# Patient Record
Sex: Female | Born: 1960 | Race: White | Hispanic: No | Marital: Married | Smoking: Former smoker
Health system: Southern US, Community
[De-identification: ages and names within clinical notes are randomized; demographics above are authoritative.]

## PROBLEM LIST (undated history)

## (undated) DIAGNOSIS — L4050 Arthropathic psoriasis, unspecified: Secondary | ICD-10-CM

## (undated) DIAGNOSIS — M1990 Unspecified osteoarthritis, unspecified site: Secondary | ICD-10-CM

## (undated) DIAGNOSIS — G43909 Migraine, unspecified, not intractable, without status migrainosus: Secondary | ICD-10-CM

## (undated) DIAGNOSIS — M797 Fibromyalgia: Secondary | ICD-10-CM

## (undated) DIAGNOSIS — F4323 Adjustment disorder with mixed anxiety and depressed mood: Secondary | ICD-10-CM

## (undated) DIAGNOSIS — T8859XA Other complications of anesthesia, initial encounter: Secondary | ICD-10-CM

## (undated) DIAGNOSIS — E7800 Pure hypercholesterolemia, unspecified: Secondary | ICD-10-CM

## (undated) DIAGNOSIS — R112 Nausea with vomiting, unspecified: Secondary | ICD-10-CM

## (undated) DIAGNOSIS — F32A Depression, unspecified: Secondary | ICD-10-CM

## (undated) DIAGNOSIS — R51 Headache: Secondary | ICD-10-CM

## (undated) DIAGNOSIS — K2950 Unspecified chronic gastritis without bleeding: Secondary | ICD-10-CM

## (undated) DIAGNOSIS — K219 Gastro-esophageal reflux disease without esophagitis: Secondary | ICD-10-CM

## (undated) DIAGNOSIS — U071 COVID-19: Secondary | ICD-10-CM

## (undated) DIAGNOSIS — R7303 Prediabetes: Secondary | ICD-10-CM

## (undated) DIAGNOSIS — Z9889 Other specified postprocedural states: Secondary | ICD-10-CM

## (undated) DIAGNOSIS — F419 Anxiety disorder, unspecified: Secondary | ICD-10-CM

## (undated) DIAGNOSIS — K589 Irritable bowel syndrome without diarrhea: Secondary | ICD-10-CM

## (undated) DIAGNOSIS — E119 Type 2 diabetes mellitus without complications: Secondary | ICD-10-CM

## (undated) DIAGNOSIS — T4145XA Adverse effect of unspecified anesthetic, initial encounter: Secondary | ICD-10-CM

## (undated) HISTORY — DX: Irritable bowel syndrome, unspecified: K58.9

## (undated) HISTORY — DX: Fibromyalgia: M79.7

## (undated) HISTORY — DX: Migraine, unspecified, not intractable, without status migrainosus: G43.909

## (undated) HISTORY — DX: Unspecified osteoarthritis, unspecified site: M19.90

## (undated) HISTORY — DX: Adjustment disorder with mixed anxiety and depressed mood: F43.23

## (undated) HISTORY — DX: Unspecified chronic gastritis without bleeding: K29.50

## (undated) HISTORY — DX: Type 2 diabetes mellitus without complications: E11.9

## (undated) HISTORY — DX: COVID-19: U07.1

## (undated) HISTORY — PX: KNEE SURGERY: SHX244

## (undated) HISTORY — DX: Pure hypercholesterolemia, unspecified: E78.00

---

## 2000-01-19 HISTORY — PX: CERVICAL DISC SURGERY: SHX588

## 2000-08-31 ENCOUNTER — Encounter: Payer: Self-pay | Admitting: *Deleted

## 2000-08-31 ENCOUNTER — Ambulatory Visit (HOSPITAL_COMMUNITY): Admission: RE | Admit: 2000-08-31 | Discharge: 2000-08-31 | Payer: Self-pay | Admitting: *Deleted

## 2000-09-29 ENCOUNTER — Encounter: Payer: Self-pay | Admitting: Neurosurgery

## 2000-09-29 ENCOUNTER — Ambulatory Visit (HOSPITAL_COMMUNITY): Admission: RE | Admit: 2000-09-29 | Discharge: 2000-10-01 | Payer: Self-pay | Admitting: Neurosurgery

## 2000-11-22 ENCOUNTER — Ambulatory Visit (HOSPITAL_COMMUNITY): Admission: RE | Admit: 2000-11-22 | Discharge: 2000-11-22 | Payer: Self-pay | Admitting: Neurosurgery

## 2000-11-22 ENCOUNTER — Encounter: Payer: Self-pay | Admitting: Neurosurgery

## 2001-04-20 ENCOUNTER — Encounter: Payer: Self-pay | Admitting: Neurosurgery

## 2001-04-20 ENCOUNTER — Encounter: Admission: RE | Admit: 2001-04-20 | Discharge: 2001-04-20 | Payer: Self-pay | Admitting: Neurosurgery

## 2007-01-19 HISTORY — PX: ANKLE FRACTURE SURGERY: SHX122

## 2012-09-06 ENCOUNTER — Other Ambulatory Visit: Payer: Self-pay | Admitting: Gastroenterology

## 2012-09-06 DIAGNOSIS — R109 Unspecified abdominal pain: Secondary | ICD-10-CM

## 2012-09-08 ENCOUNTER — Other Ambulatory Visit: Payer: Self-pay

## 2012-09-08 ENCOUNTER — Ambulatory Visit
Admission: RE | Admit: 2012-09-08 | Discharge: 2012-09-08 | Disposition: A | Discharge status: Home / Self Care | Payer: PRIVATE HEALTH INSURANCE | Source: Ambulatory Visit | Attending: Gastroenterology | Admitting: Gastroenterology

## 2012-09-08 DIAGNOSIS — R109 Unspecified abdominal pain: Secondary | ICD-10-CM

## 2012-09-08 MED ORDER — IOHEXOL 300 MG/ML  SOLN
100.0000 mL | Freq: Once | INTRAMUSCULAR | Status: AC | PRN
  Administered 2012-09-08: 100 mL via INTRAVENOUS

## 2012-09-20 HISTORY — PX: ESOPHAGOGASTRODUODENOSCOPY: SHX1529

## 2012-11-22 HISTORY — PX: COLONOSCOPY: SHX174

## 2013-07-03 ENCOUNTER — Other Ambulatory Visit: Payer: Self-pay | Admitting: Neurosurgery

## 2013-07-26 ENCOUNTER — Encounter (HOSPITAL_COMMUNITY): Payer: Self-pay | Admitting: Pharmacy Technician

## 2013-07-26 ENCOUNTER — Inpatient Hospital Stay (HOSPITAL_COMMUNITY): Admission: RE | Admit: 2013-07-26 | Payer: PRIVATE HEALTH INSURANCE | Source: Ambulatory Visit

## 2013-07-27 ENCOUNTER — Encounter (HOSPITAL_COMMUNITY): Payer: Self-pay

## 2013-07-27 ENCOUNTER — Encounter (HOSPITAL_COMMUNITY)
Admission: RE | Admit: 2013-07-27 | Discharge: 2013-07-27 | Disposition: A | Discharge status: Home / Self Care | Payer: 59 | Source: Ambulatory Visit | Attending: Neurosurgery | Admitting: Neurosurgery

## 2013-07-27 DIAGNOSIS — Z87891 Personal history of nicotine dependence: Secondary | ICD-10-CM | POA: Insufficient documentation

## 2013-07-27 DIAGNOSIS — Z01812 Encounter for preprocedural laboratory examination: Secondary | ICD-10-CM | POA: Insufficient documentation

## 2013-07-27 DIAGNOSIS — M5020 Other cervical disc displacement, unspecified cervical region: Secondary | ICD-10-CM | POA: Insufficient documentation

## 2013-07-27 HISTORY — DX: Other complications of anesthesia, initial encounter: T88.59XA

## 2013-07-27 HISTORY — DX: Headache: R51

## 2013-07-27 HISTORY — DX: Unspecified osteoarthritis, unspecified site: M19.90

## 2013-07-27 HISTORY — DX: Adverse effect of unspecified anesthetic, initial encounter: T41.45XA

## 2013-07-27 LAB — BASIC METABOLIC PANEL
Anion gap: 15 (ref 5–15)
BUN: 11 mg/dL (ref 6–23)
CO2: 25 mEq/L (ref 19–32)
Calcium: 9.1 mg/dL (ref 8.4–10.5)
Chloride: 100 mEq/L (ref 96–112)
Creatinine, Ser: 0.89 mg/dL (ref 0.50–1.10)
GFR calc Af Amer: 86 mL/min — ABNORMAL LOW (ref >90)
GFR calc non Af Amer: 74 mL/min — ABNORMAL LOW (ref >90)
Glucose, Bld: 94 mg/dL (ref 70–99)
Potassium: 3.9 mEq/L (ref 3.7–5.3)
Sodium: 140 mEq/L (ref 137–147)

## 2013-07-27 LAB — CBC WITH DIFFERENTIAL/PLATELET
Basophils Absolute: 0 10*3/uL (ref 0.0–0.1)
Basophils Relative: 0 % (ref 0–1)
Eosinophils Absolute: 0.1 10*3/uL (ref 0.0–0.7)
Eosinophils Relative: 2 % (ref 0–5)
HCT: 37.8 % (ref 36.0–46.0)
Hemoglobin: 12.4 g/dL (ref 12.0–15.0)
Lymphocytes Relative: 36 % (ref 12–46)
Lymphs Abs: 2.6 10*3/uL (ref 0.7–4.0)
MCH: 27.4 pg (ref 26.0–34.0)
MCHC: 32.8 g/dL (ref 30.0–36.0)
MCV: 83.4 fL (ref 78.0–100.0)
Monocytes Absolute: 0.6 10*3/uL (ref 0.1–1.0)
Monocytes Relative: 8 % (ref 3–12)
Neutro Abs: 3.9 10*3/uL (ref 1.7–7.7)
Neutrophils Relative %: 54 % (ref 43–77)
Platelets: 397 10*3/uL (ref 150–400)
RBC: 4.53 MIL/uL (ref 3.87–5.11)
RDW: 14.6 % (ref 11.5–15.5)
WBC: 7.2 10*3/uL (ref 4.0–10.5)

## 2013-07-27 LAB — SURGICAL PCR SCREEN
MRSA, PCR: NEGATIVE
Staphylococcus aureus: POSITIVE — AB

## 2013-07-27 LAB — HCG, SERUM, QUALITATIVE: Preg, Serum: NEGATIVE

## 2013-07-27 NOTE — Pre-Procedure Instructions (Addendum)
Mandy Grant  07/27/2013   Your procedure is scheduled on:  08/07/13  [facility] short stay admitting at 530 AM.  Call this number if you have problems the morning of surgery: [phone]   Remember:   Do not eat food or drink liquids after midnight.   Take these medicines the morning of surgery with A SIP OF WATER: none     STOP all herbel meds, nsaids (aleve,naproxen,advil,ibuprofen) 5 days prior to surgery(08/01/13) including vitamins ,aspirin   Do not wear jewelry, make-up or nail polish.  Do not wear lotions, powders, or perfumes. You may wear deodorant.  Do not shave 48 hours prior to surgery. Men may shave face and neck.  Do not bring valuables to the hospital.  [facility] is not responsible                  for any belongings or valuables.               Contacts, dentures or bridgework may not be worn into surgery.  Leave suitcase in the car. After surgery it may be brought to your room.  For patients admitted to the hospital, discharge time is determined by your                treatment team.               Patients discharged the day of surgery will not be allowed to drive  home.  Name and phone number of your driver:   Special Instructions:  Special Instructions: [name] - Preparing for Surgery  Before surgery, you can play an important role.  Because skin is not sterile, your skin needs to be as free of germs as possible.  You can reduce the number of germs on you skin by washing with CHG (chlorahexidine gluconate) soap before surgery.  CHG is an antiseptic cleaner which kills germs and bonds with the skin to continue killing germs even after washing.  Please DO NOT use if you have an allergy to CHG or antibacterial soaps.  If your skin becomes reddened/irritated stop using the CHG and inform your nurse when you arrive at Short Stay.  Do not shave (including legs and underarms) for at least 48 hours prior to the first CHG shower.  You may shave your  face.  Please follow these instructions carefully:   1.  Shower with CHG Soap the night before surgery and the morning of Surgery.  2.  If you choose to wash your hair, wash your hair first as usual with your normal shampoo.  3.  After you shampoo, rinse your hair and body thoroughly to remove the Shampoo.  4.  Use CHG as you would any other liquid soap.  You can apply chg directly  to the skin and wash gently with scrungie or a clean washcloth.  5.  Apply the CHG Soap to your body ONLY FROM THE NECK DOWN.  Do not use on open wounds or open sores.  Avoid contact with your eyes ears, mouth and genitals (private parts).  Wash genitals (private parts)       with your normal soap.  6.  Wash thoroughly, paying special attention to the area where your surgery will be performed.  7.  Thoroughly rinse your body with warm water from the neck down.  8.  DO NOT shower/wash with your normal soap after using and rinsing off the CHG Soap.  9.  Pat yourself dry  with a clean towel.            10.  Wear clean pajamas.            11.  Place clean sheets on your bed the night of your first shower and do not sleep with pets.  Day of Surgery  Do not apply any lotions/deodorants the morning of surgery.  Please wear clean clothes to the hospital/surgery center.   Please read over the following fact sheets that you were given: Pain Booklet, Coughing and Deep Breathing, MRSA Information and Surgical Site Infection Prevention

## 2013-08-06 MED ORDER — CEFAZOLIN SODIUM-DEXTROSE 2-3 GM-% IV SOLR
2.0000 g | INTRAVENOUS | Status: AC
  Administered 2013-08-07: 2 g via INTRAVENOUS
  Filled 2013-08-06: qty 50

## 2013-08-06 MED ORDER — DEXAMETHASONE SODIUM PHOSPHATE 10 MG/ML IJ SOLN
10.0000 mg | INTRAMUSCULAR | Status: DC
  Filled 2013-08-06: qty 1

## 2013-08-07 ENCOUNTER — Encounter (HOSPITAL_COMMUNITY): Payer: 59 | Admitting: Certified Registered"

## 2013-08-07 ENCOUNTER — Encounter (HOSPITAL_COMMUNITY): Payer: Self-pay | Admitting: *Deleted

## 2013-08-07 ENCOUNTER — Encounter (HOSPITAL_COMMUNITY)
Admission: RE | Disposition: A | Discharge status: Home / Self Care | Payer: Self-pay | Source: Ambulatory Visit | Attending: Neurosurgery

## 2013-08-07 ENCOUNTER — Inpatient Hospital Stay (HOSPITAL_COMMUNITY)
Admission: RE | Admit: 2013-08-07 | Discharge: 2013-08-08 | DRG: 473 | Disposition: A | Discharge status: Home / Self Care | Payer: 59 | Source: Ambulatory Visit | Attending: Neurosurgery | Admitting: Neurosurgery

## 2013-08-07 ENCOUNTER — Ambulatory Visit (HOSPITAL_COMMUNITY): Payer: 59 | Admitting: Certified Registered"

## 2013-08-07 ENCOUNTER — Ambulatory Visit (HOSPITAL_COMMUNITY): Payer: 59

## 2013-08-07 DIAGNOSIS — M5010 Cervical disc disorder with radiculopathy, unspecified cervical region: Secondary | ICD-10-CM | POA: Diagnosis present

## 2013-08-07 DIAGNOSIS — Z79899 Other long term (current) drug therapy: Secondary | ICD-10-CM

## 2013-08-07 DIAGNOSIS — Z87891 Personal history of nicotine dependence: Secondary | ICD-10-CM

## 2013-08-07 DIAGNOSIS — M5020 Other cervical disc displacement, unspecified cervical region: Principal | ICD-10-CM | POA: Diagnosis present

## 2013-08-07 DIAGNOSIS — Z981 Arthrodesis status: Secondary | ICD-10-CM

## 2013-08-07 DIAGNOSIS — Z791 Long term (current) use of non-steroidal anti-inflammatories (NSAID): Secondary | ICD-10-CM

## 2013-08-07 DIAGNOSIS — Z882 Allergy status to sulfonamides status: Secondary | ICD-10-CM

## 2013-08-07 HISTORY — PX: ANTERIOR CERVICAL DECOMP/DISCECTOMY FUSION: SHX1161

## 2013-08-07 SURGERY — ANTERIOR CERVICAL DECOMPRESSION/DISCECTOMY FUSION 1 LEVEL
Anesthesia: General | Site: Spine Cervical

## 2013-08-07 MED ORDER — PROMETHAZINE HCL 25 MG/ML IJ SOLN
12.5000 mg | Freq: Four times a day (QID) | INTRAMUSCULAR | Status: DC | PRN
  Administered 2013-08-07: 25 mg via INTRAVENOUS
  Filled 2013-08-07: qty 1

## 2013-08-07 MED ORDER — LACTATED RINGERS IV SOLN
INTRAVENOUS | Status: DC | PRN
  Administered 2013-08-07: 07:00:00 via INTRAVENOUS

## 2013-08-07 MED ORDER — SODIUM CHLORIDE 0.9 % IV SOLN: 250.0000 mL | INTRAVENOUS | Status: DC

## 2013-08-07 MED ORDER — ACETAMINOPHEN 325 MG PO TABS
650.0000 mg | ORAL_TABLET | ORAL | Status: DC | PRN
  Administered 2013-08-08: 650 mg via ORAL
  Filled 2013-08-07: qty 2

## 2013-08-07 MED ORDER — CYCLOBENZAPRINE HCL 10 MG PO TABS
10.0000 mg | ORAL_TABLET | Freq: Three times a day (TID) | ORAL | Status: DC | PRN
  Administered 2013-08-07 (×2): 10 mg via ORAL
  Filled 2013-08-07 (×2): qty 1

## 2013-08-07 MED ORDER — PHENYLEPHRINE HCL 10 MG/ML IJ SOLN
INTRAMUSCULAR | Status: DC | PRN
  Administered 2013-08-07: 80 ug via INTRAVENOUS

## 2013-08-07 MED ORDER — OXYCODONE HCL 5 MG PO TABS
5.0000 mg | ORAL_TABLET | Freq: Once | ORAL | Status: DC | PRN
Start: 2013-08-07 — End: 2013-08-07

## 2013-08-07 MED ORDER — HYDROMORPHONE HCL PF 1 MG/ML IJ SOLN
INTRAMUSCULAR | Status: AC
  Filled 2013-08-07: qty 1

## 2013-08-07 MED ORDER — SODIUM CHLORIDE 0.9 % IJ SOLN: 3.0000 mL | INTRAMUSCULAR | Status: DC | PRN

## 2013-08-07 MED ORDER — ALUM & MAG HYDROXIDE-SIMETH 200-200-20 MG/5ML PO SUSP: 30.0000 mL | Freq: Four times a day (QID) | ORAL | Status: DC | PRN

## 2013-08-07 MED ORDER — LIDOCAINE HCL (CARDIAC) 20 MG/ML IV SOLN
INTRAVENOUS | Status: AC
  Filled 2013-08-07: qty 5

## 2013-08-07 MED ORDER — 0.9 % SODIUM CHLORIDE (POUR BTL) OPTIME
TOPICAL | Status: DC | PRN
  Administered 2013-08-07: 1000 mL

## 2013-08-07 MED ORDER — HYDROMORPHONE HCL PF 1 MG/ML IJ SOLN
0.2500 mg | INTRAMUSCULAR | Status: DC | PRN
  Administered 2013-08-07 (×4): 0.5 mg via INTRAVENOUS

## 2013-08-07 MED ORDER — OXYCODONE HCL 5 MG/5ML PO SOLN: 5.0000 mg | Freq: Once | ORAL | Status: DC | PRN

## 2013-08-07 MED ORDER — ROCURONIUM BROMIDE 50 MG/5ML IV SOLN
INTRAVENOUS | Status: AC
  Filled 2013-08-07: qty 1

## 2013-08-07 MED ORDER — ONDANSETRON HCL 4 MG/2ML IJ SOLN
4.0000 mg | INTRAMUSCULAR | Status: DC | PRN
  Administered 2013-08-07 (×2): 4 mg via INTRAVENOUS
  Filled 2013-08-07: qty 2

## 2013-08-07 MED ORDER — THROMBIN 5000 UNITS EX SOLR
CUTANEOUS | Status: DC | PRN
  Administered 2013-08-07 (×2): 5000 [IU] via TOPICAL

## 2013-08-07 MED ORDER — FENTANYL CITRATE 0.05 MG/ML IJ SOLN
INTRAMUSCULAR | Status: DC | PRN
  Administered 2013-08-07: 50 ug via INTRAVENOUS
  Administered 2013-08-07: 100 ug via INTRAVENOUS
  Administered 2013-08-07: 50 ug via INTRAVENOUS

## 2013-08-07 MED ORDER — ONDANSETRON HCL 4 MG/2ML IJ SOLN
INTRAMUSCULAR | Status: AC
  Filled 2013-08-07: qty 2

## 2013-08-07 MED ORDER — HEMOSTATIC AGENTS (NO CHARGE) OPTIME
TOPICAL | Status: DC | PRN
  Administered 2013-08-07: 1 via TOPICAL

## 2013-08-07 MED ORDER — CEFAZOLIN SODIUM 1-5 GM-% IV SOLN
1.0000 g | Freq: Three times a day (TID) | INTRAVENOUS | Status: AC
  Administered 2013-08-07 – 2013-08-08 (×2): 1 g via INTRAVENOUS
  Filled 2013-08-07 (×3): qty 50

## 2013-08-07 MED ORDER — MEPERIDINE HCL 25 MG/ML IJ SOLN: 6.2500 mg | INTRAMUSCULAR | Status: DC | PRN

## 2013-08-07 MED ORDER — LIDOCAINE HCL (CARDIAC) 20 MG/ML IV SOLN
INTRAVENOUS | Status: DC | PRN
  Administered 2013-08-07: 100 mg via INTRAVENOUS

## 2013-08-07 MED ORDER — SENNA 8.6 MG PO TABS
1.0000 | ORAL_TABLET | Freq: Two times a day (BID) | ORAL | Status: DC
  Administered 2013-08-08: 8.6 mg via ORAL
  Filled 2013-08-07 (×2): qty 1

## 2013-08-07 MED ORDER — PHENOL 1.4 % MT LIQD
1.0000 | OROMUCOSAL | Status: DC | PRN
  Administered 2013-08-07: 1 via OROMUCOSAL
  Filled 2013-08-07: qty 177

## 2013-08-07 MED ORDER — HYDROMORPHONE HCL PF 1 MG/ML IJ SOLN
0.5000 mg | INTRAMUSCULAR | Status: DC | PRN
  Administered 2013-08-07 – 2013-08-08 (×3): 1 mg via INTRAVENOUS
  Filled 2013-08-07 (×3): qty 1

## 2013-08-07 MED ORDER — PROPOFOL 10 MG/ML IV BOLUS
INTRAVENOUS | Status: AC
  Filled 2013-08-07: qty 20

## 2013-08-07 MED ORDER — ONDANSETRON HCL 4 MG/2ML IJ SOLN
INTRAMUSCULAR | Status: DC | PRN
  Administered 2013-08-07: 4 mg via INTRAVENOUS

## 2013-08-07 MED ORDER — MENTHOL 3 MG MT LOZG
1.0000 | LOZENGE | OROMUCOSAL | Status: DC | PRN
  Filled 2013-08-07: qty 9

## 2013-08-07 MED ORDER — NEOSTIGMINE METHYLSULFATE 10 MG/10ML IV SOLN
INTRAVENOUS | Status: DC | PRN
  Administered 2013-08-07: 4 mg via INTRAVENOUS

## 2013-08-07 MED ORDER — MIDAZOLAM HCL 2 MG/2ML IJ SOLN
INTRAMUSCULAR | Status: AC
  Filled 2013-08-07: qty 2

## 2013-08-07 MED ORDER — SODIUM CHLORIDE 0.9 % IJ SOLN
3.0000 mL | Freq: Two times a day (BID) | INTRAMUSCULAR | Status: DC
Start: 2013-08-07 — End: 2013-08-08

## 2013-08-07 MED ORDER — DEXAMETHASONE SODIUM PHOSPHATE 4 MG/ML IJ SOLN
INTRAMUSCULAR | Status: DC | PRN
  Administered 2013-08-07: 10 mg via INTRAVENOUS

## 2013-08-07 MED ORDER — MIDAZOLAM HCL 5 MG/5ML IJ SOLN
INTRAMUSCULAR | Status: DC | PRN
  Administered 2013-08-07: 2 mg via INTRAVENOUS

## 2013-08-07 MED ORDER — OXYCODONE-ACETAMINOPHEN 5-325 MG PO TABS
1.0000 | ORAL_TABLET | ORAL | Status: DC | PRN
Start: 2013-08-07 — End: 2013-08-08
  Administered 2013-08-07: 2 via ORAL
  Filled 2013-08-07: qty 2

## 2013-08-07 MED ORDER — GLYCOPYRROLATE 0.2 MG/ML IJ SOLN
INTRAMUSCULAR | Status: DC | PRN
Start: 2013-08-07 — End: 2013-08-07
  Administered 2013-08-07: 0.6 mg via INTRAVENOUS

## 2013-08-07 MED ORDER — HYDROCODONE-ACETAMINOPHEN 5-325 MG PO TABS
1.0000 | ORAL_TABLET | ORAL | Status: DC | PRN
Start: 2013-08-07 — End: 2013-08-08
  Administered 2013-08-08: 1 via ORAL
  Filled 2013-08-07: qty 1

## 2013-08-07 MED ORDER — ROCURONIUM BROMIDE 100 MG/10ML IV SOLN
INTRAVENOUS | Status: DC | PRN
  Administered 2013-08-07: 50 mg via INTRAVENOUS

## 2013-08-07 MED ORDER — ONDANSETRON HCL 4 MG/2ML IJ SOLN: 4.0000 mg | Freq: Once | INTRAMUSCULAR | Status: DC | PRN

## 2013-08-07 MED ORDER — SODIUM CHLORIDE 0.9 % IR SOLN
Status: DC | PRN
  Administered 2013-08-07: 09:00:00

## 2013-08-07 MED ORDER — PROPOFOL 10 MG/ML IV BOLUS
INTRAVENOUS | Status: DC | PRN
  Administered 2013-08-07: 120 mg via INTRAVENOUS

## 2013-08-07 MED ORDER — ACETAMINOPHEN 650 MG RE SUPP: 650.0000 mg | RECTAL | Status: DC | PRN

## 2013-08-07 MED ORDER — FENTANYL CITRATE 0.05 MG/ML IJ SOLN
INTRAMUSCULAR | Status: AC
  Filled 2013-08-07: qty 5

## 2013-08-07 SURGICAL SUPPLY — 61 items
BAG DECANTER FOR FLEXI CONT (MISCELLANEOUS) ×2 IMPLANT
BENZOIN TINCTURE PRP APPL 2/3 (GAUZE/BANDAGES/DRESSINGS) ×2 IMPLANT
BLADE 10 SAFETY STRL DISP (BLADE) IMPLANT
BRUSH SCRUB EZ PLAIN DRY (MISCELLANEOUS) ×2 IMPLANT
BUR MATCHSTICK NEURO 3.0 LAGG (BURR) ×2 IMPLANT
CANISTER SUCT 3000ML (MISCELLANEOUS) ×2 IMPLANT
CONT SPEC 4OZ CLIKSEAL STRL BL (MISCELLANEOUS) ×2 IMPLANT
DRAPE C-ARM 42X72 X-RAY (DRAPES) ×4 IMPLANT
DRAPE LAPAROTOMY 100X72 PEDS (DRAPES) ×2 IMPLANT
DRAPE MICROSCOPE LEICA (MISCELLANEOUS) ×2 IMPLANT
DRAPE POUCH INSTRU U-SHP 10X18 (DRAPES) ×2 IMPLANT
DRILL BIT (BIT) ×2 IMPLANT
DURAPREP 6ML APPLICATOR 50/CS (WOUND CARE) ×2 IMPLANT
ELECT COATED BLADE 2.86 ST (ELECTRODE) ×2 IMPLANT
ELECT REM PT RETURN 9FT ADLT (ELECTROSURGICAL) ×2
ELECTRODE REM PT RTRN 9FT ADLT (ELECTROSURGICAL) ×1 IMPLANT
GAUZE SPONGE 4X4 16PLY XRAY LF (GAUZE/BANDAGES/DRESSINGS) IMPLANT
GLOVE BIO SURGEON STRL SZ8 (GLOVE) ×2 IMPLANT
GLOVE BIOGEL PI IND STRL 7.0 (GLOVE) ×3 IMPLANT
GLOVE BIOGEL PI IND STRL 8.5 (GLOVE) ×1 IMPLANT
GLOVE BIOGEL PI INDICATOR 7.0 (GLOVE) ×3
GLOVE BIOGEL PI INDICATOR 8.5 (GLOVE) ×1
GLOVE ECLIPSE 8.5 STRL (GLOVE) ×2 IMPLANT
GLOVE ECLIPSE 9.0 STRL (GLOVE) ×2 IMPLANT
GLOVE EXAM NITRILE LRG STRL (GLOVE) IMPLANT
GLOVE EXAM NITRILE MD LF STRL (GLOVE) IMPLANT
GLOVE EXAM NITRILE XL STR (GLOVE) IMPLANT
GLOVE EXAM NITRILE XS STR PU (GLOVE) IMPLANT
GLOVE SURG SS PI 7.0 STRL IVOR (GLOVE) ×6 IMPLANT
GOWN STRL REUS W/ TWL LRG LVL3 (GOWN DISPOSABLE) ×1 IMPLANT
GOWN STRL REUS W/ TWL XL LVL3 (GOWN DISPOSABLE) ×3 IMPLANT
GOWN STRL REUS W/TWL 2XL LVL3 (GOWN DISPOSABLE) IMPLANT
GOWN STRL REUS W/TWL LRG LVL3 (GOWN DISPOSABLE) ×1
GOWN STRL REUS W/TWL XL LVL3 (GOWN DISPOSABLE) ×3
HALTER HD/CHIN CERV TRACTION D (MISCELLANEOUS) ×2 IMPLANT
KIT BASIN OR (CUSTOM PROCEDURE TRAY) ×2 IMPLANT
KIT ROOM TURNOVER OR (KITS) ×2 IMPLANT
NEEDLE SPNL 20GX3.5 QUINCKE YW (NEEDLE) ×2 IMPLANT
NS IRRIG 1000ML POUR BTL (IV SOLUTION) ×2 IMPLANT
PACK LAMINECTOMY NEURO (CUSTOM PROCEDURE TRAY) ×2 IMPLANT
PAD ARMBOARD 7.5X6 YLW CONV (MISCELLANEOUS) ×6 IMPLANT
PEEK CAGE 7X14X11 (Cage) ×2 IMPLANT
PLATE ELITE VISION 25MM (Plate) ×2 IMPLANT
RUBBERBAND STERILE (MISCELLANEOUS) ×4 IMPLANT
SCREW ST 13X4.5XST VAR NS (Screw) ×1 IMPLANT
SCREW ST 13X4XST VA NS SPNE (Screw) ×4 IMPLANT
SCREW ST VAR 4 ATL (Screw) ×4 IMPLANT
SCREW ST VAR 4.5 ATL (Screw) ×1 IMPLANT
SPONGE GAUZE 4X4 12PLY (GAUZE/BANDAGES/DRESSINGS) ×2 IMPLANT
SPONGE INTESTINAL PEANUT (DISPOSABLE) ×2 IMPLANT
SPONGE SURGIFOAM ABS GEL SZ50 (HEMOSTASIS) ×2 IMPLANT
STRIP CLOSURE SKIN 1/2X4 (GAUZE/BANDAGES/DRESSINGS) ×2 IMPLANT
SUT PDS AB 5-0 P3 18 (SUTURE) ×2 IMPLANT
SUT VIC AB 3-0 SH 8-18 (SUTURE) ×2 IMPLANT
SYR 20ML ECCENTRIC (SYRINGE) ×2 IMPLANT
TAPE CLOTH 4X10 WHT NS (GAUZE/BANDAGES/DRESSINGS) ×2 IMPLANT
TAPE CLOTH SURG 4X10 WHT LF (GAUZE/BANDAGES/DRESSINGS) ×2 IMPLANT
TOWEL OR 17X24 6PK STRL BLUE (TOWEL DISPOSABLE) ×2 IMPLANT
TOWEL OR 17X26 10 PK STRL BLUE (TOWEL DISPOSABLE) ×2 IMPLANT
TRAP SPECIMEN MUCOUS 40CC (MISCELLANEOUS) ×2 IMPLANT
WATER STERILE IRR 1000ML POUR (IV SOLUTION) ×2 IMPLANT

## 2013-08-07 NOTE — Transfer of Care (Signed)
`  Immediate Anesthesia Transfer of Care Note  Patient: Mandy Grant  Procedure(s) Performed: Procedure(s): CERVICAL SIX-SEVEN ANTERIOR CERVICAL DECOMPRESSION FUSION WITH PEEK CAGE,PLATING. (N/A)  Patient Location: PACU  Anesthesia Type:General  Level of Consciousness: awake, alert , oriented and patient cooperative  Airway & Oxygen Therapy: Patient Spontanous Breathing and Patient connected to nasal cannula oxygen  Post-op Assessment: Report given to PACU RN, Post -op Vital signs reviewed and stable and Patient moving all extremities  Post vital signs: Reviewed and stable  Complications: No apparent anesthesia complications

## 2013-08-07 NOTE — Progress Notes (Signed)
Utilization review completed.  

## 2013-08-07 NOTE — Anesthesia Preprocedure Evaluation (Signed)
Anesthesia Evaluation  Patient identified by MRN, date of birth, ID band Patient awake    Reviewed: Allergy & Precautions, H&P , NPO status , Patient's Chart, lab work & pertinent test results  Airway Mallampati: I TM Distance: >3 FB Neck ROM: Full    Dental   Pulmonary former smoker,          Cardiovascular     Neuro/Psych    GI/Hepatic   Endo/Other    Renal/GU      Musculoskeletal   Abdominal   Peds  Hematology   Anesthesia Other Findings   Reproductive/Obstetrics                           Anesthesia Physical Anesthesia Plan  ASA: II  Anesthesia Plan: General   Post-op Pain Management:    Induction: Intravenous  Airway Management Planned: Oral ETT  Additional Equipment:   Intra-op Plan:   Post-operative Plan: Extubation in OR  Informed Consent: I have reviewed the patients History and Physical, chart, labs and discussed the procedure including the risks, benefits and alternatives for the proposed anesthesia with the patient or authorized representative who has indicated his/her understanding and acceptance.     Plan Discussed with: CRNA and Surgeon  Anesthesia Plan Comments:         Anesthesia Quick Evaluation  

## 2013-08-07 NOTE — Anesthesia Postprocedure Evaluation (Signed)
Anesthesia Post Note  Patient: Mandy Grant  Procedure(s) Performed: Procedure(s) (LRB): CERVICAL SIX-SEVEN ANTERIOR CERVICAL DECOMPRESSION FUSION WITH PEEK CAGE,PLATING. (N/A)  Anesthesia type: general  Patient location: PACU  Post pain: Pain level controlled  Post assessment: Patient's Cardiovascular Status Stable  Last Vitals:  Filed Vitals:   08/07/13 1111  BP:   Pulse: 83  Temp:   Resp: 15    Post vital signs: Reviewed and stable  Level of consciousness: sedated  Complications: No apparent anesthesia complications

## 2013-08-07 NOTE — Op Note (Signed)
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  Postoperative diagnosis: Same  Procedure Name: Reexploration of anterior cervical fusion C4-C6 with removal instrumentation.   C6-7 anterior cervical discectomy with interbody fusion utilizing interbody peek cage, locally harvested autograft, and anterior plate instrumentation.  Surgeon:Durenda Pechacek A.Hoa Deriso, M.D.  Asst. Surgeon: Venetia MaxonStern  Anesthesia: General  Indication: 53 year old female status post previous C4-C6 anterior cervical discectomy infusion presents with neck pain with left upper extremity symptoms consistent with a left-sided C7 radiculopathy per workup demonstrates evidence of marked disc degeneration with a left-sided C6-7 disc herniation causing compression the lateral spinal cord and exiting C7 nerve root. Patient presents now for C6-7 into her cervical discectomy and fusion. This will require removal of her previous C6-C4 anterior cervical plating.  Operative note: After induction anesthesia, patient positioned supine with her neck slightly extended and held in place with halter traction. Anterior cervical region prepped and draped. Incision made through old scar. This carried down sharply to the platysma. This is then divided vertically and dissection proceeded on the medial border of the sternocleidomastoid muscle and carotid sheath. Trachea and esophagus were mobilized and retracted towards the left. Prevertebral fascia was stripped off the anterior spinal column. Longus colli muscles elevated bilaterally/Carter. Deep self-retaining traction placed intraoperative fluoroscopy used levels were confirmed. Previous instrumentation from C4-C6 was dissected free. The plate was disassembled and removed. Fusion from C4-C6 was quite solid. Disc space at C6-7 was then incised 15  blade in her a 10 or fashion. Anterior osteophytes removed using Leksell rongeurs. Bone was saved for later autografting. Discectomy been performed using pituitary rongeurs forward and backward angle Carlen curettes Kerrison rongeurs the high-speed drill. Almost the disc removed down to level of the posterior annulus. Microscope and brought into the field and used throughout the remainder of the discectomy. Remaining aspects of annulus and osteophytes removed using a high-speed drill down to level of the posterior longitudinal ligament. Posterior longitudinal ligament was elevated and resected so fashion appears rongeurs. Underlying thecal sac was identified. Y. center decompression and perform anything the bodies of C6 and C7. Decompression MCH are afraid are wide anterior foraminotomies were then performed on course exiting C7 nerve roots bilaterally. All elements the disc herniation and spondylosis appeared to be removed. There is no evidence of injury to thecal sac and nerve roots. Wound is then irrigated with and bike solution. Gelfoam was placed topically for hemostasis which Ascent be good. 7 mm peek cage was then packed with morselized autograft and intact his into the C6-7 disc space and recessed slightly from the anterior cortical margin. 25 mm Atlantis anterior cervical plate was then placed over the C6 and C7 levels. This is an attachment or fluoroscopic guidance using 13 mm variable-angle screws 2 each at both levels were all 4 screws were given a final tightening down to be solidly within the bone. Locking screws engaged both levels. Final images revealed good position the bone graft and hardware at proper upper level with normal lamina spine. Wounds that area one final time. Hemostasis was assured with bipolar cautery. Wounds and close in layers. Steri-Strips triggers were applied. There no apparent competitions. Patient tolerated the procedure well and she returns to the recovery room postop.

## 2013-08-07 NOTE — Anesthesia Procedure Notes (Addendum)
Procedure Name: Intubation Date/Time: 08/07/2013 7:54 AM Performed by: Jerilee HohMUMM, Tessia Kassin N Pre-anesthesia Checklist: Patient identified, Emergency Drugs available, Suction available, Patient being monitored and Timeout performed Patient Re-evaluated:Patient Re-evaluated prior to inductionOxygen Delivery Method: Circle system utilized Preoxygenation: Pre-oxygenation with 100% oxygen Intubation Type: IV induction Ventilation: Mask ventilation without difficulty Laryngoscope Size: Miller and 3 Grade View: Grade I Tube type: Oral Tube size: 7.0 mm Number of attempts: 1 Airway Equipment and Method: Stylet Placement Confirmation: ETT inserted through vocal cords under direct vision,  positive ETCO2 and breath sounds checked- equal and bilateral Secured at: 20 cm Tube secured with: Tape Dental Injury: Teeth and Oropharynx as per pre-operative assessment

## 2013-08-07 NOTE — Brief Op Note (Signed)
08/07/2013  9:18 AM  PATIENT:  Mandy Grant  53 y.o. female  PRE-OPERATIVE DIAGNOSIS:  spondylosis  POST-OPERATIVE DIAGNOSIS:  spondylosis  PROCEDURE:  Procedure(s): CERVICAL SIX-SEVEN ANTERIOR CERVICAL DECOMPRESSION FUSION WITH PEEK CAGE,PLATING. (N/A)  SURGEON:  Surgeon(s) and Role:    * Temple PaciniHenry A Jyll Tomaro, MD - Primary  PHYSICIAN ASSISTANT:   ASSISTANTS: Stern   ANESTHESIA:   general  EBL:  Total I/O In: 1000 [I.V.:1000] Out: -   BLOOD ADMINISTERED:none  DRAINS: none   LOCAL MEDICATIONS USED:  NONE  SPECIMEN:  No Specimen  DISPOSITION OF SPECIMEN:  N/A  COUNTS:  YES  TOURNIQUET:  * No tourniquets in log *  DICTATION: .Dragon Dictation  PLAN OF CARE: Admit to inpatient   PATIENT DISPOSITION:  PACU - hemodynamically stable.   Delay start of Pharmacological VTE agent (>24hrs) due to surgical blood loss or risk of bleeding: yes

## 2013-08-07 NOTE — H&P (Signed)
Mandy Grant is an 53 y.o. female.   Chief Complaint: And neck and left arm pain HPI: 53 year old female status post previous C4-C6 anterior cervical setting fusion presents now with neck pain with radiation to her left upper trimming associated with weakness and some sensory loss. Workup has demonstrated evidence of a significant left-sided C6-7 disc herniation. Patient's failed conservative management and presents now for anterior cervical decompression and fusion.  Past Medical History  Diagnosis Date  . Complication of anesthesia     "hard time waking up"  . Arthritis   . Headache(784.0)     constant with nausea    Past Surgical History  Procedure Laterality Date  . Cervical disc surgery  02  . Ankle fracture surgery Right 09    plates screws    History reviewed. No pertinent family history. Social History:  reports that she quit smoking about 27 years ago. Her smoking use included Cigarettes. She has a 2 pack-year smoking history. She does not have any smokeless tobacco history on file. She reports that she does not drink alcohol or use illicit drugs.  Allergies:  Allergies  Allergen Reactions  . Sulfa Antibiotics Nausea And Vomiting and Rash    Medications Prior to Admission  Medication Sig Dispense Refill  . ibuprofen (ADVIL,MOTRIN) 200 MG tablet Take 200 mg by mouth every 6 (six) hours as needed.      . naproxen sodium (ANAPROX) 220 MG tablet Take 220 mg by mouth 2 (two) times daily with a meal.        No results found for this or any previous visit (from the past 48 hour(s)). No results found.  Review of Systems  Constitutional: Negative.   HENT: Negative.   Eyes: Negative.   Respiratory: Negative.   Cardiovascular: Negative.   Gastrointestinal: Negative.   Genitourinary: Negative.   Musculoskeletal: Negative.   Skin: Negative.   Neurological: Negative.   Endo/Heme/Allergies: Negative.   Psychiatric/Behavioral: Negative.     Blood pressure 124/58, pulse  73, temperature 98.5 F (36.9 C), temperature source Oral, resp. rate 20, weight 89.812 kg (198 lb), SpO2 98.00%. Physical Exam  Constitutional: She is oriented to person, place, and time. She appears well-developed and well-nourished. No distress.  HENT:  Head: Normocephalic and atraumatic.  Right Ear: External ear normal.  Left Ear: External ear normal.  Nose: Nose normal.  Mouth/Throat: Oropharynx is clear and moist. No oropharyngeal exudate.  Eyes: Conjunctivae and EOM are normal. Pupils are equal, round, and reactive to light. Right eye exhibits no discharge. Left eye exhibits no discharge.  Neck: Normal range of motion. Neck supple. No tracheal deviation present. No thyromegaly present.  Cardiovascular: Normal rate, regular rhythm, normal heart sounds and intact distal pulses.  Exam reveals no friction rub.   No murmur heard. Respiratory: Effort normal and breath sounds normal. No respiratory distress. She has no wheezes.  GI: Soft. Bowel sounds are normal. She exhibits no distension. There is no tenderness.  Musculoskeletal: Normal range of motion. She exhibits no edema and no tenderness.  Neurological: She is alert and oriented to person, place, and time. She has normal reflexes. She displays normal reflexes. No cranial nerve deficit. She exhibits normal muscle tone. Coordination normal.  Skin: Skin is warm and dry. No rash noted. She is not diaphoretic. No erythema. No pallor.  Psychiatric: She has a normal mood and affect. Her behavior is normal. Thought content normal.     Assessment/Plan Left C6-7 herniated nuclear pulposus with radiculopathy. Plan reexploration of  previous C4-C6 fusion with removal of instrumentation. C6-7 anterior cervical discectomy with interbody cage, local harvested autograft, and anterior plate instrumentation. Risks and benefits been explained. Patient wishes to proceed.  Kate Larock A 08/07/2013, 7:33 AM

## 2013-08-08 MED ORDER — CYCLOBENZAPRINE HCL 10 MG PO TABS: 10.0000 mg | ORAL_TABLET | Freq: Three times a day (TID) | ORAL | Status: DC | PRN

## 2013-08-08 MED ORDER — HYDROCODONE-ACETAMINOPHEN 5-325 MG PO TABS: 1.0000 | ORAL_TABLET | ORAL | Status: DC | PRN

## 2013-08-08 MED ORDER — PROMETHAZINE HCL 12.5 MG PO TABS: 12.5000 mg | ORAL_TABLET | Freq: Four times a day (QID) | ORAL | Status: DC | PRN

## 2013-08-08 NOTE — Discharge Instructions (Signed)

## 2013-08-08 NOTE — Discharge Summary (Signed)
Physician Discharge Summary  Patient ID: Mandy Grant MRN: [id number] DOB/AGE: 52/01/1960 52 y.o.  Admit date: 08/07/2013 Discharge date: 08/08/2013  Admission Diagnoses:  Discharge Diagnoses:  Principal Problem:   HNP (herniated nucleus pulposus), cervical Active Problems:   Herniation of cervical intervertebral disc with radiculopathy   Discharged Condition: good  Hospital Course: Patient admitted to the hospital where she underwent an uncomplicated C6-7 anterior cervical discectomy and fusion. Postoperative she is doing well. Neck and upper extremity symptoms improved. Wound healing well. Up ambulating. Ready for discharge home.  Consults:   Significant Diagnostic Studies:   Treatments:   Discharge Exam: Blood pressure 123/65, pulse 93, temperature 98.6 F (37 C), temperature source Oral, resp. rate 20, weight 89.812 kg (198 lb), SpO2 95.00%. Awake and alert. Oriented and appropriate. Cranial nerve function intact. Motor and sensory function of the extremities intact. Wound clean and dry. Chest and abdomen benign.  Disposition:      Medication List         cyclobenzaprine 10 MG tablet  Commonly known as:  FLEXERIL  Take 1 tablet (10 mg total) by mouth 3 (three) times daily as needed for muscle spasms.     HYDROcodone-acetaminophen 5-325 MG per tablet  Commonly known as:  NORCO/VICODIN  Take 1-2 tablets by mouth every 4 (four) hours as needed for moderate pain.     ibuprofen 200 MG tablet  Commonly known as:  ADVIL,MOTRIN  Take 200 mg by mouth every 6 (six) hours as needed.     naproxen sodium 220 MG tablet  Commonly known as:  ANAPROX  Take 220 mg by mouth 2 (two) times daily with a meal.     promethazine 12.5 MG tablet  Commonly known as:  PHENERGAN  Take 1 tablet (12.5 mg total) by mouth every 6 (six) hours as needed for nausea or vomiting.         Signed: Duyen Beckom A 08/08/2013, 10:59 AM

## 2013-08-08 NOTE — Progress Notes (Signed)
Patient alert and oriented, mae's well, voiding adequate amount of urine, swallowing without difficulty, and no c/o pain. Patient discharged home with family. Script and discharged instructions given to patient. Patient and family stated understanding of instructions given.

## 2013-08-09 ENCOUNTER — Encounter (HOSPITAL_COMMUNITY): Payer: Self-pay | Admitting: Neurosurgery

## 2016-01-07 ENCOUNTER — Ambulatory Visit (INDEPENDENT_AMBULATORY_CARE_PROVIDER_SITE_OTHER): Payer: 59 | Admitting: Sports Medicine

## 2016-01-07 ENCOUNTER — Encounter: Payer: Self-pay | Admitting: Sports Medicine

## 2016-01-07 ENCOUNTER — Ambulatory Visit (INDEPENDENT_AMBULATORY_CARE_PROVIDER_SITE_OTHER): Payer: 59

## 2016-01-07 DIAGNOSIS — M79672 Pain in left foot: Secondary | ICD-10-CM | POA: Diagnosis not present

## 2016-01-07 DIAGNOSIS — M722 Plantar fascial fibromatosis: Secondary | ICD-10-CM

## 2016-01-07 MED ORDER — TRIAMCINOLONE ACETONIDE 40 MG/ML IJ SUSP: 20.0000 mg | Freq: Once | INTRAMUSCULAR | Status: DC

## 2016-01-07 MED ORDER — DICLOFENAC SODIUM 75 MG PO TBEC: 75.0000 mg | DELAYED_RELEASE_TABLET | Freq: Two times a day (BID) | ORAL | 0 refills | Status: DC

## 2016-01-07 NOTE — Patient Instructions (Signed)

## 2016-01-07 NOTE — Progress Notes (Signed)
Subjective: April HoldingJeanne Manygoats is a 55 y.o. female patient presents to office with complaint of heel pain on the left. Patient admits to post static dyskinesia for months in duration. Patient has treated this problem with stretching, icing, medrol which she hasn't started taking yet with no relief. Denies any other pedal complaints.   Patient Active Problem List   Diagnosis Date Noted  . HNP (herniated nucleus pulposus), cervical 08/07/2013  . Herniation of cervical intervertebral disc with radiculopathy 08/07/2013    Current Outpatient Prescriptions on File Prior to Visit  Medication Sig Dispense Refill  . cyclobenzaprine (FLEXERIL) 10 MG tablet Take 1 tablet (10 mg total) by mouth 3 (three) times daily as needed for muscle spasms. 30 tablet 0  . HYDROcodone-acetaminophen (NORCO/VICODIN) 5-325 MG per tablet Take 1-2 tablets by mouth every 4 (four) hours as needed for moderate pain. 60 tablet 0  . ibuprofen (ADVIL,MOTRIN) 200 MG tablet Take 200 mg by mouth every 6 (six) hours as needed.    . naproxen sodium (ANAPROX) 220 MG tablet Take 220 mg by mouth 2 (two) times daily with a meal.    . promethazine (PHENERGAN) 12.5 MG tablet Take 1 tablet (12.5 mg total) by mouth every 6 (six) hours as needed for nausea or vomiting. 30 tablet 0   No current facility-administered medications on file prior to visit.     Allergies  Allergen Reactions  . Sulfa Antibiotics Nausea And Vomiting and Rash    Objective: Physical Exam General: The patient is alert and oriented x3 in no acute distress.  Dermatology: Skin is warm, dry and supple bilateral lower extremities. Nails 1-10 are normal. There is no erythema, edema, no eccymosis, no open lesions present. Integument is otherwise unremarkable.  Vascular: Dorsalis Pedis pulse and Posterior Tibial pulse are 2/4 bilateral. Capillary fill time is immediate to all digits.  Neurological: Grossly intact to light touch with an achilles reflex of +2/5 and a negative  Tinel's sign bilateral.  Musculoskeletal: Tenderness to palpation at the medial calcaneal tubercale and through the insertion of the plantar fascia on the left foot. No pain with compression of calcaneus bilateral. No pain with tuning fork to calcaneus bilateral. No pain with calf compression bilateral. There is decreased Ankle joint range of motion bilateral. All other joints range of motion within normal limits bilateral. Strength 5/5 in all groups bilateral.   Gait: Unassisted, Antalgic avoid weight on left/right heel  Xray, Left foot:  Normal osseous mineralization. Joint spaces preserved. No fracture/dislocation/boney destruction. Minimal Calcaneal spur present with mild thickening of plantar fascia. No other soft tissue abnormalities or radiopaque foreign bodies.   Assessment and Plan: Problem List Items Addressed This Visit    None    Visit Diagnoses    Plantar fasciitis of left foot    -  Primary   Relevant Medications   triamcinolone acetonide (KENALOG-40) injection 20 mg   diclofenac (VOLTAREN) 75 MG EC tablet   diclofenac (VOLTAREN) 75 MG EC tablet   Other Relevant Orders   DG Foot 2 Views Left   Pain of left heel       Relevant Medications   triamcinolone acetonide (KENALOG-40) injection 20 mg   diclofenac (VOLTAREN) 75 MG EC tablet   diclofenac (VOLTAREN) 75 MG EC tablet   Other Relevant Orders   DG Foot 2 Views Left      -Complete examination performed.  -Xrays reviewed -Discussed with patient in detail the condition of plantar fasciitis, how this occurs and general treatment options. Explained  both conservative and surgical treatments.  -After oral consent and aseptic prep, injected a mixture containing 1 ml of 2% plain lidocaine, 1 ml 0.5% plain marcaine, 0.5 ml of kenalog 10 and 0.5 ml of dexamethasone phosphate into left heel. Post-injection care discussed with patient.  -Rx Diclofenac to start after Medrol dose pack is completed of which she already  has -Recommended good supportive shoes and advised use of OTC insert. Explained to patient that if these orthoses work well, we will continue with these. If these do not improve her condition and  pain, we will consider a new pair of custom molded orthoses. - Explained in detail the use of the fascial brace for left which was dispensed at today's visit. -Explained and dispensed to patient daily stretching exercises. -Recommend patient to ice affected area 1-2x daily. -Patient to return to office in 3-4 weeks for follow up or sooner if problems or questions arise.  Asencion Islamitorya Sharnese Heath, DPM

## 2016-01-28 ENCOUNTER — Ambulatory Visit (INDEPENDENT_AMBULATORY_CARE_PROVIDER_SITE_OTHER): Payer: 59 | Admitting: Sports Medicine

## 2016-01-28 ENCOUNTER — Encounter: Payer: Self-pay | Admitting: Sports Medicine

## 2016-01-28 DIAGNOSIS — M722 Plantar fascial fibromatosis: Secondary | ICD-10-CM

## 2016-01-28 DIAGNOSIS — M79672 Pain in left foot: Secondary | ICD-10-CM

## 2016-01-28 NOTE — Progress Notes (Signed)
Subjective: Mandy Grant is a 56 y.o. female patient returns for follow up eval after injection #1 three weeks ago for heel pai/fasciitis on the left. Patient states that symptoms are 2/10 but has not been icing, stretching, wearing brace like she should since she has been caring for her mom who is sick. Denies any other pedal complaints.   Patient Active Problem List   Diagnosis Date Noted  . HNP (herniated nucleus pulposus), cervical 08/07/2013  . Herniation of cervical intervertebral disc with radiculopathy 08/07/2013    Current Outpatient Prescriptions on File Prior to Visit  Medication Sig Dispense Refill  . amoxicillin-clavulanate (AUGMENTIN) 875-125 MG tablet     . cyclobenzaprine (FLEXERIL) 10 MG tablet Take 1 tablet (10 mg total) by mouth 3 (three) times daily as needed for muscle spasms. 30 tablet 0  . diclofenac (VOLTAREN) 75 MG EC tablet Take 1 tablet (75 mg total) by mouth 2 (two) times daily. 30 tablet 0  . diclofenac (VOLTAREN) 75 MG EC tablet Take 1 tablet (75 mg total) by mouth 2 (two) times daily. 28 tablet 0  . HYDROcodone-acetaminophen (NORCO/VICODIN) 5-325 MG per tablet Take 1-2 tablets by mouth every 4 (four) hours as needed for moderate pain. 60 tablet 0  . ibuprofen (ADVIL,MOTRIN) 200 MG tablet Take 200 mg by mouth every 6 (six) hours as needed.    Marland Kitchen JANUMET 50-500 MG tablet     . naproxen sodium (ANAPROX) 220 MG tablet Take 220 mg by mouth 2 (two) times daily with a meal.    . predniSONE (DELTASONE) 10 MG tablet     . promethazine (PHENERGAN) 12.5 MG tablet Take 1 tablet (12.5 mg total) by mouth every 6 (six) hours as needed for nausea or vomiting. 30 tablet 0   Current Facility-Administered Medications on File Prior to Visit  Medication Dose Route Frequency Provider Last Rate Last Dose  . triamcinolone acetonide (KENALOG-40) injection 20 mg  20 mg Other Once Asencion Islam, DPM        Allergies  Allergen Reactions  . Sulfa Antibiotics Nausea And Vomiting and  Rash    Objective: Physical Exam General: The patient is alert and oriented x3 in no acute distress.  Dermatology: Skin is warm, dry and supple bilateral lower extremities. Nails 1-10 are normal. There is no erythema, edema, no eccymosis, no open lesions present. Integument is otherwise unremarkable.  Vascular: Dorsalis Pedis pulse and Posterior Tibial pulse are 2/4 bilateral. Capillary fill time is immediate to all digits.  Neurological: Grossly intact to light touch with an achilles reflex of +2/5 and a negative Tinel's sign bilateral.  Musculoskeletal: Decreased tenderness to palpation at the medial calcaneal tubercale and through the insertion of the plantar fascia on the left foot. No pain with compression of calcaneus bilateral. No pain with tuning fork to calcaneus bilateral. No pain with calf compression bilateral. There is decreased Ankle joint range of motion bilateral. All other joints range of motion within normal limits bilateral. Strength 5/5 in all groups bilateral.   Assessment and Plan: Problem List Items Addressed This Visit    None    Visit Diagnoses    Plantar fasciitis of left foot    -  Primary   Pain of left heel          -Complete examination performed.  -Re-discussed with patient in detail the condition of plantar fasciitis, how this occurs and general treatment options. Explained both conservative and surgical treatments.  -Continue with monitoring  -Recommended good supportive shoes and  inserts; patient would like to get custom inserts later this year. -Continue with fascial brace. -Continue with daily stretching  -Recommend patient to ice affected area 1-2x daily as previously instructed to prevent a flare up. -Patient to return to office in 6 weeks for follow up or sooner if problems or questions arise.  Asencion Islamitorya Anuar Walgren, DPM

## 2016-02-04 ENCOUNTER — Ambulatory Visit: Payer: 59 | Admitting: Sports Medicine

## 2016-02-05 ENCOUNTER — Ambulatory Visit: Payer: 59 | Admitting: Sports Medicine

## 2016-02-06 ENCOUNTER — Encounter: Payer: Self-pay | Admitting: Sports Medicine

## 2016-02-06 ENCOUNTER — Ambulatory Visit (INDEPENDENT_AMBULATORY_CARE_PROVIDER_SITE_OTHER): Payer: 59 | Admitting: Sports Medicine

## 2016-02-06 VITALS — BP 102/59 | HR 89 | Resp 18

## 2016-02-06 DIAGNOSIS — M722 Plantar fascial fibromatosis: Secondary | ICD-10-CM | POA: Diagnosis not present

## 2016-02-06 NOTE — Progress Notes (Signed)
Subjective: Mandy Grant is a 56 y.o. female patient returns for follow up eval after injection #1, 4 weeks ago for heel pain/fasciitis on the left at medial aspect. Patient states that symptoms are starting to worsen and has been icing, stretching, wearing brace. Reports she wants shot because she's going to be on her foot a lot. Denies any other pedal complaints.   Patient Active Problem List   Diagnosis Date Noted  . HNP (herniated nucleus pulposus), cervical 08/07/2013  . Herniation of cervical intervertebral disc with radiculopathy 08/07/2013    Current Outpatient Prescriptions on File Prior to Visit  Medication Sig Dispense Refill  . amoxicillin-clavulanate (AUGMENTIN) 875-125 MG tablet     . cyclobenzaprine (FLEXERIL) 10 MG tablet Take 1 tablet (10 mg total) by mouth 3 (three) times daily as needed for muscle spasms. 30 tablet 0  . diclofenac (VOLTAREN) 75 MG EC tablet Take 1 tablet (75 mg total) by mouth 2 (two) times daily. 30 tablet 0  . diclofenac (VOLTAREN) 75 MG EC tablet Take 1 tablet (75 mg total) by mouth 2 (two) times daily. 28 tablet 0  . HYDROcodone-acetaminophen (NORCO/VICODIN) 5-325 MG per tablet Take 1-2 tablets by mouth every 4 (four) hours as needed for moderate pain. 60 tablet 0  . ibuprofen (ADVIL,MOTRIN) 200 MG tablet Take 200 mg by mouth every 6 (six) hours as needed.    Marland Kitchen. JANUMET 50-500 MG tablet     . naproxen sodium (ANAPROX) 220 MG tablet Take 220 mg by mouth 2 (two) times daily with a meal.    . predniSONE (DELTASONE) 10 MG tablet     . promethazine (PHENERGAN) 12.5 MG tablet Take 1 tablet (12.5 mg total) by mouth every 6 (six) hours as needed for nausea or vomiting. 30 tablet 0   Current Facility-Administered Medications on File Prior to Visit  Medication Dose Route Frequency Provider Last Rate Last Dose  . triamcinolone acetonide (KENALOG-40) injection 20 mg  20 mg Other Once Asencion Islamitorya Mikaela Hilgeman, DPM        Allergies  Allergen Reactions  . Sulfa Antibiotics  Nausea And Vomiting and Rash    Objective: Physical Exam General: The patient is alert and oriented x3 in no acute distress.  Dermatology: Skin is warm, dry and supple bilateral lower extremities. Nails 1-10 are normal. There is no erythema, edema, no eccymosis, no open lesions present. Integument is otherwise unremarkable.  Vascular: Dorsalis Pedis pulse and Posterior Tibial pulse are 2/4 bilateral. Capillary fill time is immediate to all digits.  Neurological: Grossly intact to light touch with an achilles reflex of +2/5 and a negative Tinel's sign bilateral.  Musculoskeletal: No tenderness to palpation at the medial calcaneal tubercale however there is tenderness at lateral aspect and through the insertion of the plantar fascia on the left foot. No pain with compression of calcaneus bilateral. No pain with tuning fork to calcaneus bilateral. No pain with calf compression bilateral. There is decreased Ankle joint range of motion bilateral. All other joints range of motion within normal limits bilateral. Strength 5/5 in all groups bilateral.   Assessment and Plan: Problem List Items Addressed This Visit    None    Visit Diagnoses    Plantar fasciitis of left foot    -  Primary   lateral band      -Complete examination performed.  -Re-discussed with patient in detail the condition of plantar fasciitis, how this occurs and general treatment options. Explained both conservative and surgical treatments.  -After oral consent and  aseptic prep, injected a mixture containing 1 ml of 2% plain lidocaine, 1 ml 0.5% plain marcaine, 0.5 ml of kenalog 10 and 0.5 ml of dexamethasone phosphate into left heel lateral band without complication. Post-injection care discussed with patient.  -Recommended good supportive shoes and inserts; patient would like to get custom inserts later this year. -Rx night splint -May use TENS unit that she has -Continue with fascial brace. -Continue with daily stretching   -Recommend patient to ice affected area 1-2x daily as previously instructed to prevent a flare up. -Patient to return to office as scheduled for follow up or sooner if problems or questions arise. If no improvement PT/EPAT.  Asencion Islam, DPM

## 2016-03-10 ENCOUNTER — Ambulatory Visit (INDEPENDENT_AMBULATORY_CARE_PROVIDER_SITE_OTHER): Payer: 59 | Admitting: Sports Medicine

## 2016-03-10 ENCOUNTER — Encounter: Payer: Self-pay | Admitting: Sports Medicine

## 2016-03-10 DIAGNOSIS — M722 Plantar fascial fibromatosis: Secondary | ICD-10-CM

## 2016-03-10 DIAGNOSIS — M79672 Pain in left foot: Secondary | ICD-10-CM | POA: Diagnosis not present

## 2016-03-10 MED ORDER — DICLOFENAC SODIUM 75 MG PO TBEC: 75.0000 mg | DELAYED_RELEASE_TABLET | Freq: Two times a day (BID) | ORAL | 0 refills | Status: DC

## 2016-03-10 NOTE — Patient Instructions (Signed)

## 2016-03-10 NOTE — Progress Notes (Signed)
Subjective: Mandy Grant is a 56 y.o. female patient returns for follow up eval after injection #2, 4 weeks ago for heel pain/fasciitis on the left at medial and lateral aspect. Patient states that symptoms are still flaring up, feels about the same. Patient is getting frustrated because she feels that its taking long to heal and that she has a lot going on with her mom being sick and recent trip that prevents this from getting better. Denies any other pedal complaints.   Patient Active Problem List   Diagnosis Date Noted  . HNP (herniated nucleus pulposus), cervical 08/07/2013  . Herniation of cervical intervertebral disc with radiculopathy 08/07/2013    Current Outpatient Prescriptions on File Prior to Visit  Medication Sig Dispense Refill  . amoxicillin-clavulanate (AUGMENTIN) 875-125 MG tablet     . cyclobenzaprine (FLEXERIL) 10 MG tablet Take 1 tablet (10 mg total) by mouth 3 (three) times daily as needed for muscle spasms. 30 tablet 0  . diclofenac (VOLTAREN) 75 MG EC tablet Take 1 tablet (75 mg total) by mouth 2 (two) times daily. 30 tablet 0  . diclofenac (VOLTAREN) 75 MG EC tablet Take 1 tablet (75 mg total) by mouth 2 (two) times daily. 28 tablet 0  . HYDROcodone-acetaminophen (NORCO/VICODIN) 5-325 MG per tablet Take 1-2 tablets by mouth every 4 (four) hours as needed for moderate pain. 60 tablet 0  . ibuprofen (ADVIL,MOTRIN) 200 MG tablet Take 200 mg by mouth every 6 (six) hours as needed.    Marland Kitchen JANUMET 50-500 MG tablet     . naproxen sodium (ANAPROX) 220 MG tablet Take 220 mg by mouth 2 (two) times daily with a meal.    . predniSONE (DELTASONE) 10 MG tablet     . promethazine (PHENERGAN) 12.5 MG tablet Take 1 tablet (12.5 mg total) by mouth every 6 (six) hours as needed for nausea or vomiting. 30 tablet 0   Current Facility-Administered Medications on File Prior to Visit  Medication Dose Route Frequency Provider Last Rate Last Dose  . triamcinolone acetonide (KENALOG-40) injection  20 mg  20 mg Other Once Mandy Islam, DPM        Allergies  Allergen Reactions  . Sulfa Antibiotics Nausea And Vomiting and Rash    Objective: Physical Exam General: The patient is alert and oriented x3 in no acute distress.  Dermatology: Skin is warm, dry and supple bilateral lower extremities. Nails 1-10 are normal. There is no erythema, edema, no eccymosis, no open lesions present. Integument is otherwise unremarkable.  Vascular: Dorsalis Pedis pulse and Posterior Tibial pulse are 2/4 bilateral. Capillary fill time is immediate to all digits.  Neurological: Grossly intact to light touch with an achilles reflex of +2/5 and a negative Tinel's sign bilateral.  Musculoskeletal: Mild tenderness to palpation at the medial calcaneal tubercale and at lateral aspect and through the insertion of the plantar fascia on the left foot. No pain with compression of calcaneus bilateral. No pain with tuning fork to calcaneus bilateral. No pain with calf compression bilateral. There is decreased Ankle joint range of motion bilateral. All other joints range of motion within normal limits bilateral. Strength 5/5 in all groups bilateral.   Assessment and Plan: Problem List Items Addressed This Visit    None    Visit Diagnoses    Plantar fasciitis of left foot    -  Primary   Relevant Medications   diclofenac (VOLTAREN) 75 MG EC tablet   Pain of left heel  Relevant Medications   diclofenac (VOLTAREN) 75 MG EC tablet     -Complete examination performed.  -Re-discussed with patient in detail the condition of plantar fasciitis, how this occurs and general treatment options. Explained both conservative and surgical treatments.  -Patient opt for home PT -Rx [facility] topical anti-inflammatory -Refilled diclofenac  -Recommended good supportive shoes and inserts; patient would like to get custom inserts later this year. -Continue with night splint -May use TENS unit that she has  -Continue with  fascial brace. -Continue with daily stretching  -Recommend patient to ice affected area 1-2x daily as previously instructed to prevent a flare up. -Patient to return to office as needed for follow up or sooner if problems or questions arise. Patient will call office for Rx for PT if she has no improvement with home PT  Mandy Grant, DPM

## 2016-03-11 ENCOUNTER — Telehealth: Payer: Self-pay | Admitting: *Deleted

## 2016-03-11 MED ORDER — NONFORMULARY OR COMPOUNDED ITEM: 2 refills | Status: DC

## 2016-03-11 NOTE — Telephone Encounter (Addendum)
-----   Message from Asencion Islamitorya [place], [place] sent at 03/10/2016  1:36 PM EST ----- Regarding: [facility] Topical anti-inflammatory cream. 03/11/2016-[facility] orders to [facility].

## 2016-08-12 DIAGNOSIS — K635 Polyp of colon: Secondary | ICD-10-CM | POA: Insufficient documentation

## 2016-08-12 DIAGNOSIS — E559 Vitamin D deficiency, unspecified: Secondary | ICD-10-CM | POA: Insufficient documentation

## 2016-08-12 DIAGNOSIS — E663 Overweight: Secondary | ICD-10-CM | POA: Insufficient documentation

## 2016-08-12 DIAGNOSIS — M19042 Primary osteoarthritis, left hand: Secondary | ICD-10-CM | POA: Insufficient documentation

## 2016-08-12 DIAGNOSIS — E782 Mixed hyperlipidemia: Secondary | ICD-10-CM | POA: Insufficient documentation

## 2016-08-12 DIAGNOSIS — J3089 Other allergic rhinitis: Secondary | ICD-10-CM | POA: Insufficient documentation

## 2016-08-12 DIAGNOSIS — E1165 Type 2 diabetes mellitus with hyperglycemia: Secondary | ICD-10-CM

## 2016-08-12 HISTORY — DX: Type 2 diabetes mellitus with hyperglycemia: E11.65

## 2016-11-04 ENCOUNTER — Ambulatory Visit (INDEPENDENT_AMBULATORY_CARE_PROVIDER_SITE_OTHER): Payer: 59 | Admitting: Sports Medicine

## 2016-11-04 ENCOUNTER — Encounter: Payer: Self-pay | Admitting: Sports Medicine

## 2016-11-04 DIAGNOSIS — M722 Plantar fascial fibromatosis: Secondary | ICD-10-CM

## 2016-11-04 DIAGNOSIS — Z8781 Personal history of (healed) traumatic fracture: Secondary | ICD-10-CM

## 2016-11-04 DIAGNOSIS — M79672 Pain in left foot: Secondary | ICD-10-CM

## 2016-11-04 NOTE — Progress Notes (Signed)
   Subjective:    Patient ID: Mandy Grant, female    DOB: 12/12/1960, 56 y.o.   MRN: [id number]  HPI    Review of Systems  All other systems reviewed and are negative.      Objective:   Physical Exam        Assessment & Plan:

## 2016-11-04 NOTE — Progress Notes (Signed)
Subjective: Mandy Grant is a 56 y.o. female patient returns for follow up eval of heel pain/fasciitis on the left  Reports symptoms are better at most 2/10. Reports that she wants new orthotics. Denies any other pedal complaints.   Patient Active Problem List   Diagnosis Date Noted  . HNP (herniated nucleus pulposus), cervical 08/07/2013  . Herniation of cervical intervertebral disc with radiculopathy 08/07/2013    Current Outpatient Prescriptions on File Prior to Visit  Medication Sig Dispense Refill  . amoxicillin-clavulanate (AUGMENTIN) 875-125 MG tablet     . cyclobenzaprine (FLEXERIL) 10 MG tablet Take 1 tablet (10 mg total) by mouth 3 (three) times daily as needed for muscle spasms. 30 tablet 0  . diclofenac (VOLTAREN) 75 MG EC tablet Take 1 tablet (75 mg total) by mouth 2 (two) times daily. 30 tablet 0  . diclofenac (VOLTAREN) 75 MG EC tablet Take 1 tablet (75 mg total) by mouth 2 (two) times daily. 28 tablet 0  . HYDROcodone-acetaminophen (NORCO/VICODIN) 5-325 MG per tablet Take 1-2 tablets by mouth every 4 (four) hours as needed for moderate pain. 60 tablet 0  . ibuprofen (ADVIL,MOTRIN) 200 MG tablet Take 200 mg by mouth every 6 (six) hours as needed.    Marland Kitchen. JANUMET 50-500 MG tablet     . naproxen sodium (ANAPROX) 220 MG tablet Take 220 mg by mouth 2 (two) times daily with a meal.    . NONFORMULARY OR COMPOUNDED ITEM [facility]: Antiinflammatory Cream - Diclofenac 3%, Baclofen 2%, Lidocaine 2%, apply 1-2 grams to affected area 3-4 times daily. 120 each 2  . predniSONE (DELTASONE) 10 MG tablet     . promethazine (PHENERGAN) 12.5 MG tablet Take 1 tablet (12.5 mg total) by mouth every 6 (six) hours as needed for nausea or vomiting. 30 tablet 0   Current Facility-Administered Medications on File Prior to Visit  Medication Dose Route Frequency Provider Last Rate Last Dose  . triamcinolone acetonide (KENALOG-40) injection 20 mg  20 mg Other Once Asencion IslamStover, Roan Miklos, DPM         Allergies  Allergen Reactions  . Sulfa Antibiotics Nausea And Vomiting and Rash    Objective: Physical Exam General: The patient is alert and oriented x3 in no acute distress.  Dermatology: Skin is warm, dry and supple bilateral lower extremities. Nails 1-10 are normal. There is no erythema, edema, no eccymosis, no open lesions present. Integument is otherwise unremarkable.  Vascular: Dorsalis Pedis pulse and Posterior Tibial pulse are 2/4 bilateral. Capillary fill time is immediate to all digits.  Neurological: Grossly intact to light touch with an achilles reflex of +2/5 and a negative Tinel's sign bilateral.  Musculoskeletal: Minimal tenderness to palpation at the medial calcaneal tubercale and at lateral aspect and through the insertion of the plantar fascia on the left foot. No pain with compression of calcaneus bilateral. No pain with tuning fork to calcaneus bilateral. No pain with calf compression bilateral. There is decreased Ankle joint range of motion bilateral. All other joints range of motion within normal limits bilateral. Strength 5/5 in all groups bilateral.  History of Right ankle fracture with occassional residual stiffness.   Assessment and Plan: Problem List Items Addressed This Visit    None    Visit Diagnoses    Plantar fasciitis of left foot    -  Primary   Pain of left heel       History of fracture of right ankle         -Complete examination performed.  -  Re-discussed with patient in detail the condition of plantar fasciitis, how this occurs and general treatment options. Explained both conservative and surgical treatments.  -Patient casted for custom molded orthotics with [employer] today, Rx sent to [facility] -Continue with daily stretching  -Return to office for pick up orthotics   Asencion Islam, DPM

## 2016-12-08 ENCOUNTER — Ambulatory Visit: Payer: 59 | Admitting: *Deleted

## 2016-12-08 DIAGNOSIS — M722 Plantar fascial fibromatosis: Secondary | ICD-10-CM

## 2016-12-08 NOTE — Patient Instructions (Signed)

## 2016-12-08 NOTE — Progress Notes (Signed)
Patient ID: Mandy Grant, female   DOB: 07/06/1960, 56 y.o.   MRN: [id number]   Patient presents for orthotic pick up.  Verbal and written break in and wear instructions given.  Patient will follow up in 4 weeks if symptoms worsen or fail to improve.

## 2017-10-25 ENCOUNTER — Encounter: Payer: Self-pay | Admitting: Gastroenterology

## 2018-07-19 DIAGNOSIS — G8929 Other chronic pain: Secondary | ICD-10-CM | POA: Insufficient documentation

## 2018-07-19 DIAGNOSIS — M25461 Effusion, right knee: Secondary | ICD-10-CM | POA: Insufficient documentation

## 2018-08-20 ENCOUNTER — Encounter: Payer: Self-pay | Admitting: Gastroenterology

## 2018-09-21 DIAGNOSIS — R0602 Shortness of breath: Secondary | ICD-10-CM | POA: Insufficient documentation

## 2018-09-22 DIAGNOSIS — R062 Wheezing: Secondary | ICD-10-CM | POA: Insufficient documentation

## 2018-09-22 DIAGNOSIS — R5381 Other malaise: Secondary | ICD-10-CM | POA: Insufficient documentation

## 2018-10-01 DIAGNOSIS — M791 Myalgia, unspecified site: Secondary | ICD-10-CM | POA: Insufficient documentation

## 2018-10-01 DIAGNOSIS — M2550 Pain in unspecified joint: Secondary | ICD-10-CM | POA: Insufficient documentation

## 2018-10-12 ENCOUNTER — Encounter: Payer: Self-pay | Admitting: Gastroenterology

## 2018-10-12 ENCOUNTER — Ambulatory Visit: Payer: PRIVATE HEALTH INSURANCE | Admitting: Gastroenterology

## 2018-10-12 ENCOUNTER — Other Ambulatory Visit: Payer: Self-pay

## 2018-10-12 VITALS — BP 130/72 | HR 84 | Temp 98.3°F | Ht 62.0 in | Wt 188.1 lb

## 2018-10-12 DIAGNOSIS — K581 Irritable bowel syndrome with constipation: Secondary | ICD-10-CM

## 2018-10-12 DIAGNOSIS — R945 Abnormal results of liver function studies: Secondary | ICD-10-CM

## 2018-10-12 DIAGNOSIS — K219 Gastro-esophageal reflux disease without esophagitis: Secondary | ICD-10-CM

## 2018-10-12 DIAGNOSIS — K760 Fatty (change of) liver, not elsewhere classified: Secondary | ICD-10-CM

## 2018-10-12 DIAGNOSIS — R7989 Other specified abnormal findings of blood chemistry: Secondary | ICD-10-CM

## 2018-10-12 DIAGNOSIS — Z86010 Personal history of colonic polyps: Secondary | ICD-10-CM | POA: Diagnosis not present

## 2018-10-12 MED ORDER — NA SULFATE-K SULFATE-MG SULF 17.5-3.13-1.6 GM/177ML PO SOLN: 1.0000 | Freq: Once | ORAL | 0 refills | Status: AC

## 2018-10-12 MED ORDER — PANTOPRAZOLE SODIUM 40 MG PO TBEC: 40.0000 mg | DELAYED_RELEASE_TABLET | Freq: Every day | ORAL | 3 refills | Status: DC

## 2018-10-12 NOTE — Patient Instructions (Addendum)
If you are age 57 or older, your body mass index should be between 23-30. Your Body mass index is 34.41 kg/m. If this is out of the aforementioned range listed, please consider follow up with your Primary Care Provider.  If you are age 75 or younger, your body mass index should be between 19-25. Your Body mass index is 34.41 kg/m. If this is out of the aformentioned range listed, please consider follow up with your Primary Care Provider.   To help prevent the possible spread of infection to our patients, communities, and staff; we will be implementing the following measures:  As of now we are not allowing any visitors/family members to accompany you to any upcoming appointments with [facility]. If you have any concerns about this please contact our office to discuss prior to the appointment.   You have been give a sample of  Linzess 129mcg  Please purchase the following medications over the counter and take as directed: Miralax 17g mixed with water by mouth once daily.  We have sent the following medications to your pharmacy for you to pick up at your convenience: Protonix 40mg  daily  You have been scheduled for an endoscopy and colonoscopy. Please follow the written instructions given to you at your visit today. Please pick up your prep supplies at the pharmacy within the next 1-3 days. If you use inhalers (even only as needed), please bring them with you on the day of your procedure. Your physician has requested that you go to www.startemmi.com and enter the access code given to you at your visit today. This web site gives a general overview about your procedure. However, you should still follow specific instructions given to you by our office regarding your preparation for the procedure.   You have been scheduled for an abdominal ultrasound at [facility] (1st floor [address] ). Please arrive 15 minutes prior to your appointment for registration. Make certain not to  have anything to eat or drink 6 hours prior to your appointment. Should you need to reschedule your appointment, please contact radiology at [phone]. This test typically takes about 30 minutes to perform.  Thank you,  Dr. Jackquline Denmark

## 2018-10-12 NOTE — Progress Notes (Signed)
Chief Complaint: Abdominal pain  Referring Provider:  Myrlene Broker, MD      ASSESSMENT AND PLAN;   #1. Postprandial GERD and epi pain.  #2. IBS with constipation  #3. H/O Polyps 09/2012. Due for rpt.  #4. Abn LFTs d/t fatty liver (likely)./ Dx with pre-DM (Dr Unk Lightning). Recent Covid-19 08/2018  Plan: - Linzess 145 mcg PO QD PRN every weekend. Samples given. - Start Miralax 17g po qd every day. - Protonix '40mg'$  po qd (#30), 6 refills - [place] abdo for further evaluation. - EGD/colon in 3-4 weeks at [facility] - If still with problems and the above WU is negative, proceed with CT scan abdo/pelvis.  -I have encouraged her to start exercising, watch calorie intake , avoid fried foods with him to reduce 6lb over 3 months. - FU in 12 weeks.  At follow-up, we would recheck LFTs.    HPI:    Gaytha Raybourn is a 58 y.o. female  Had COVID-19 08/2018, still can't smell and get short of breath when she walks.  Chest x-ray was unremarkable at Dr. Unk Lightning office.  She is definitely getting better but gradually.   Has been having postprandial epigastric and left upper quadrant abdominal pain with abdominal bloating. "  Feels like there is a baby on her left side".  Associated with abdominal bloating and constipation.  Would have pellet-like stools at the frequency of 1-2 times per week.  She has been drinking plenty of water.  No nonsteroidals.  When she takes Linzess 145 mcg, she has diarrhea lasting for 4 to 5 hours " just like somebody opened a faucet"  No melena or hematochezia.  Has occasional heartburn.  No odynophagia or dysphagia.  Seen at [facility] 08/20/2018, Dx with COVID-19.  WBC count was 14, hemoglobin 13.2, platelets 383, normal CMP except glucose 158, CO2 21, LDH 708, AST 40, ALT 57, alk phos 64.  Normal C-reactive protein and ferritin.  Normal PT PTT.  Her PO2 was 71  Subsequently seen by Dr. Unk Lightning and had blood work on 09/21/2018 which showed improvement in WBC count at  6.1, hemoglobin 13.9, platelets 364.  Liver function tests have normalized to AST 25, ALT 36, alk phos 54.  Hemoglobin A1c was 6.3.  She had negative chest x-ray.  Had normal lipase.  She is also been evaluated by rheumatology for possible Sjogren's syndrome.  Had negative ANA, rheumatoid factor.   Past GI procedures: -Colonoscopy 11/2012 ([facility])-colonic polyp S/P polypectomy, Bx- TA.,  Small internal hemorrhoids.  Otherwise normal to TI. Past Medical History:  Diagnosis Date  . Arthritis   . Complication of anesthesia    "hard time waking up"  . COVID-19 virus infection   . Headache(784.0)    constant with nausea  . IBS (irritable bowel syndrome)     Past Surgical History:  Procedure Laterality Date  . ANKLE FRACTURE SURGERY Right 09   plates screws  . ANTERIOR CERVICAL DECOMP/DISCECTOMY FUSION N/A 08/07/2013   Procedure: CERVICAL SIX-SEVEN ANTERIOR CERVICAL DECOMPRESSION FUSION WITH PEEK CAGE,PLATING.;  Surgeon: Charlie Pitter, MD;  Location: [place] [facility];  Service: Neurosurgery;  Laterality: N/A;  . CERVICAL DISC SURGERY  02  . COLONOSCOPY  11/22/2012   Colonic polyp, status post ploypectomy. Small internal hemorrhoids. Otherwise normal colonoscopy to terminal ileum  . ESOPHAGOGASTRODUODENOSCOPY  09/20/2012   Mild gastritis. Minimal scalloping od duodenal musosa of questionable importance (biopsied)    Family History  Problem Relation Age of Onset  . Colon cancer  Neg Hx   . Esophageal cancer Neg Hx     Social History   Tobacco Use  . Smoking status: Former Smoker    Packs/day: 0.50    Years: 4.00    Pack years: 2.00    Types: Cigarettes    Quit date: 07/28/1986    Years since quitting: 32.2  . Smokeless tobacco: Never Used  Substance Use Topics  . Alcohol use: No  . Drug use: No    Current Outpatient Medications  Medication Sig Dispense Refill  . Acetaminophen (TYLENOL PO) Take by mouth as needed.    Marland Kitchen estradiol-norethindrone (COMBIPATCH) 0.05-0.25 MG/DAY as  directed. 1 patch 2 times a week    . fluticasone (FLONASE) 50 MCG/ACT nasal spray as directed.    . linaclotide (LINZESS) 145 MCG CAPS capsule Take 1 capsule by mouth daily.    . meloxicam (MOBIC) 15 MG tablet Take 15 mg by mouth as needed.    . Vitamin D, Ergocalciferol, (DRISDOL) 1.25 MG (50000 UT) CAPS capsule 1 capsule once a week.    Marland Kitchen amoxicillin-clavulanate (AUGMENTIN) 875-125 MG tablet as needed.     . cyclobenzaprine (FLEXERIL) 10 MG tablet Take 1 tablet (10 mg total) by mouth 3 (three) times daily as needed for muscle spasms. (Patient not taking: Reported on 10/12/2018) 30 tablet 0  . diclofenac (VOLTAREN) 75 MG EC tablet Take 1 tablet (75 mg total) by mouth 2 (two) times daily. (Patient not taking: Reported on 10/12/2018) 28 tablet 0  . ipratropium-albuterol (DUONEB) 0.5-2.5 (3) MG/3ML SOLN INHALE 1 VIAL VIA NEBULIZER TWICE A DAY    . NONFORMULARY OR COMPOUNDED ITEM [facility]: Antiinflammatory Cream - Diclofenac 3%, Baclofen 2%, Lidocaine 2%, apply 1-2 grams to affected area 3-4 times daily. (Patient not taking: Reported on 10/12/2018) 120 each 2   Current Facility-Administered Medications  Medication Dose Route Frequency Provider Last Rate Last Dose  . triamcinolone acetonide (KENALOG-40) injection 20 mg  20 mg Other Once Landis Martins, DPM        Allergies  Allergen Reactions  . Levofloxacin Other (See Comments)  . Sulfa Antibiotics Nausea And Vomiting and Rash    Review of Systems:  Constitutional: Denies fever, chills, diaphoresis, appetite change and has fatigue.  HEENT: Cannot smell.  Taste has returned. Respiratory: Denies SOB, DOE, cough, chest tightness,  and wheezing.   Cardiovascular: Denies chest pain, palpitations and leg swelling.  Genitourinary: Denies dysuria, urgency, frequency, hematuria, flank pain and difficulty urinating.  Musculoskeletal: Has myalgias, back pain, joint swelling, arthralgias and gait problem.  Skin: No rash.  Neurological: Denies  dizziness, seizures, syncope, weakness, light-headedness, numbness and has headaches.  Hematological: Denies adenopathy. Easy bruising, personal or family bleeding history  Psychiatric/Behavioral: Has anxiety or depression     Physical Exam:    BP 130/72   Pulse 84   Temp 98.3 F (36.8 C)   Ht '5\' 2"'$  (1.575 m)   Wt 188 lb 2 oz (85.3 kg)   BMI 34.41 kg/m  Filed Weights   10/12/18 1335  Weight: 188 lb 2 oz (85.3 kg)   Constitutional:  Well-developed, in no acute distress. Psychiatric: Normal mood and affect. Behavior is normal. HEENT: Pupils normal.  Conjunctivae are normal. No scleral icterus. Neck supple.  Cardiovascular: Normal rate, regular rhythm. No edema Pulmonary/chest: Effort normal and breath sounds normal. No wheezing, rales or rhonchi. Abdominal: Soft, nondistended.  Epigastric tenderness. bowel sounds active throughout. There are no masses palpable. No hepatomegaly. Rectal:  defered Neurological: Alert and oriented  to person place and time. Skin: Skin is warm and dry. No rashes noted.  Data Reviewed: I have personally reviewed following labs and imaging studies    Carmell Austria, MD 10/12/2018, 1:56 PM  Cc: Myrlene Broker, MD

## 2018-10-13 ENCOUNTER — Ambulatory Visit (HOSPITAL_BASED_OUTPATIENT_CLINIC_OR_DEPARTMENT_OTHER)
Admission: RE | Admit: 2018-10-13 | Discharge: 2018-10-13 | Disposition: A | Discharge status: Home / Self Care | Payer: 59 | Source: Ambulatory Visit | Attending: Gastroenterology | Admitting: Gastroenterology

## 2018-10-13 DIAGNOSIS — R945 Abnormal results of liver function studies: Secondary | ICD-10-CM | POA: Insufficient documentation

## 2018-10-13 DIAGNOSIS — K581 Irritable bowel syndrome with constipation: Secondary | ICD-10-CM

## 2018-10-13 DIAGNOSIS — K219 Gastro-esophageal reflux disease without esophagitis: Secondary | ICD-10-CM

## 2018-10-13 DIAGNOSIS — K760 Fatty (change of) liver, not elsewhere classified: Secondary | ICD-10-CM | POA: Insufficient documentation

## 2018-10-13 DIAGNOSIS — Z86010 Personal history of colonic polyps: Secondary | ICD-10-CM | POA: Diagnosis present

## 2018-10-13 DIAGNOSIS — R7989 Other specified abnormal findings of blood chemistry: Secondary | ICD-10-CM

## 2018-11-03 ENCOUNTER — Telehealth: Payer: Self-pay | Admitting: Gastroenterology

## 2018-11-06 ENCOUNTER — Other Ambulatory Visit: Payer: Self-pay

## 2018-11-06 ENCOUNTER — Ambulatory Visit (AMBULATORY_SURGERY_CENTER): Payer: PRIVATE HEALTH INSURANCE | Admitting: Gastroenterology

## 2018-11-06 ENCOUNTER — Encounter: Payer: Self-pay | Admitting: Gastroenterology

## 2018-11-06 VITALS — BP 136/76 | HR 82 | Temp 98.4°F | Resp 14 | Ht 62.0 in | Wt 188.0 lb

## 2018-11-06 DIAGNOSIS — K2970 Gastritis, unspecified, without bleeding: Secondary | ICD-10-CM | POA: Diagnosis not present

## 2018-11-06 DIAGNOSIS — K219 Gastro-esophageal reflux disease without esophagitis: Secondary | ICD-10-CM

## 2018-11-06 DIAGNOSIS — Z860100 Personal history of colon polyps, unspecified: Secondary | ICD-10-CM

## 2018-11-06 MED ORDER — SODIUM CHLORIDE 0.9 % IV SOLN: 500.0000 mL | Freq: Once | INTRAVENOUS | Status: DC

## 2018-11-06 NOTE — Progress Notes (Signed)
Temp taken by LS VS taken by CW 

## 2018-11-06 NOTE — Progress Notes (Signed)
Report to PACU, RN, vss, BBS= Clear.  

## 2018-11-06 NOTE — Progress Notes (Signed)
Called to room to assist during endoscopic procedure.  Patient ID and intended procedure confirmed with present staff. Received instructions for my participation in the procedure from the performing physician.  

## 2018-11-06 NOTE — Patient Instructions (Signed)
Handouts given: Gastritis and Hemorrhoids  No NSAIDS (Advil, Motrin, Ibuprofen, Aleve, Naprosyn, Etc. )  YOU HAD AN ENDOSCOPIC PROCEDURE TODAY AT [facility]:   Refer to the procedure report that was given to you for any specific questions about what was found during the examination.  If the procedure report does not answer your questions, please call your gastroenterologist to clarify.  If you requested that your care partner not be given the details of your procedure findings, then the procedure report has been included in a sealed envelope for you to review at your convenience later.  YOU SHOULD EXPECT: Some feelings of bloating in the abdomen. Passage of more gas than usual.  Walking can help get rid of the air that was put into your GI tract during the procedure and reduce the bloating. If you had a lower endoscopy (such as a colonoscopy or flexible sigmoidoscopy) you may notice spotting of blood in your stool or on the toilet paper. If you underwent a bowel prep for your procedure, you may not have a normal bowel movement for a few days.  Please Note:  You might notice some irritation and congestion in your nose or some drainage.  This is from the oxygen used during your procedure.  There is no need for concern and it should clear up in a day or so.  SYMPTOMS TO REPORT IMMEDIATELY:   Following lower endoscopy (colonoscopy or flexible sigmoidoscopy):  Excessive amounts of blood in the stool  Significant tenderness or worsening of abdominal pains  Swelling of the abdomen that is new, acute  Fever of 100F or higher   Following upper endoscopy (EGD)  Vomiting of blood or coffee ground material  New chest pain or pain under the shoulder blades  Painful or persistently difficult swallowing  New shortness of breath  Fever of 100F or higher  Black, tarry-looking stools  For urgent or emergent issues, a gastroenterologist can be reached at any hour by calling [phone]  [phone].   DIET:  We do recommend a small meal at first, but then you may proceed to your regular diet.  Drink plenty of fluids but you should avoid alcoholic beverages for 24 hours.  ACTIVITY:  You should plan to take it easy for the rest of today and you should NOT DRIVE or use heavy machinery until tomorrow (because of the sedation medicines used during the test).    FOLLOW UP: Our staff will call the number listed on your records 48-72 hours following your procedure to check on you and address any questions or concerns that you may have regarding the information given to you following your procedure. If we do not reach you, we will leave a message.  We will attempt to reach you two times.  During this call, we will ask if you have developed any symptoms of COVID 19. If you develop any symptoms (ie: fever, flu-like symptoms, shortness of breath, cough etc.) before then, please call [phone].  If you test positive for Covid 19 in the 2 weeks post procedure, please call and report this information to [place].    If any biopsies were taken you will be contacted by phone or by letter within the next 1-3 weeks.  Please call us at [phone] if you have not heard about the biopsies in 3 weeks.    SIGNATURES/CONFIDENTIALITY: You and/or your care partner have signed paperwork which will be entered into your electronic medical record.  These signatures attest to the  fact that that the information above on your After Visit Summary has been reviewed and is understood.  Full responsibility of the confidentiality of this discharge information lies with you and/or your care-partner.

## 2018-11-06 NOTE — Op Note (Signed)
[facility] Patient Name: Mandy Grant Procedure Date: 11/06/2018 1:43 PM MRN: [id number] Endoscopist: Lynann Bologna , MD Age: 58 Referring MD:  Date of Birth: March 18, 1960 Gender: Female Account #: [id number] Procedure:                Colonoscopy Indications:              High risk colon cancer surveillance: Personal                            history of colonic polyps Medicines:                Monitored Anesthesia Care Procedure:                Pre-Anesthesia Assessment:                           - Prior to the procedure, a History and Physical                            was performed, and patient medications and                            allergies were reviewed. The patient's tolerance of                            previous anesthesia was also reviewed. The risks                            and benefits of the procedure and the sedation                            options and risks were discussed with the patient.                            All questions were answered, and informed consent                            was obtained. Prior Anticoagulants: The patient has                            taken no previous anticoagulant or antiplatelet                            agents. ASA Grade Assessment: II - A patient with                            mild systemic disease. After reviewing the risks                            and benefits, the patient was deemed in                            satisfactory condition to undergo the procedure.  After obtaining informed consent, the colonoscope                            was passed under direct vision. Throughout the                            procedure, the patient's blood pressure, pulse, and                            oxygen saturations were monitored continuously. The                            Colonoscope was introduced through the anus and                            advanced to the 2 cm into the ileum. The                             colonoscopy was performed without difficulty. The                            patient tolerated the procedure well. The quality                            of the bowel preparation was excellent. The                            terminal ileum, ileocecal valve, appendiceal                            orifice, and rectum were photographed. Scope In: 1:59:55 PM Scope Out: 2:07:34 PM Scope Withdrawal Time: 0 hours 5 minutes 36 seconds  Total Procedure Duration: 0 hours 7 minutes 39 seconds  Findings:                 Non-bleeding internal hemorrhoids were found during                            retroflexion. The hemorrhoids were small.                           The terminal ileum appeared normal.                           The exam was otherwise without abnormality on                            direct and retroflexion views. Complications:            No immediate complications. Estimated Blood Loss:     Estimated blood loss: none. Impression:               - Small internal hemorrhoids.                           - Otherwise normal colonoscopy to TI. Recommendation:           -  Patient has a contact number available for                            emergencies. The signs and symptoms of potential                            delayed complications were discussed with the                            patient. Return to normal activities tomorrow.                            Written discharge instructions were provided to the                            patient.                           - Resume previous diet.                           - Continue present medications.                           - Repeat colonoscopy in 10 years for screening                            purposes. Earlier, if with any new problems or                            change in family history.                           - Return to GI clinic PRN. Jackquline Denmark, MD 11/06/2018 2:15:49 PM This report has been  signed electronically.

## 2018-11-06 NOTE — Op Note (Signed)
[facility] Patient Name: Mandy Grant Procedure Date: 11/06/2018 1:44 PM MRN: [id number] Endoscopist: Jackquline Denmark , MD Age: 58 Referring MD:  Date of Birth: 27-Mar-1960 Gender: Female Account #: [id number] Procedure:                Upper GI endoscopy Indications:              Epigastric abdominal pain Medicines:                Monitored Anesthesia Care Procedure:                Pre-Anesthesia Assessment:                           - Prior to the procedure, a History and Physical                            was performed, and patient medications and                            allergies were reviewed. The patient's tolerance of                            previous anesthesia was also reviewed. The risks                            and benefits of the procedure and the sedation                            options and risks were discussed with the patient.                            All questions were answered, and informed consent                            was obtained. Prior Anticoagulants: The patient has                            taken no previous anticoagulant or antiplatelet                            agents. ASA Grade Assessment: II - A patient with                            mild systemic disease. After reviewing the risks                            and benefits, the patient was deemed in                            satisfactory condition to undergo the procedure.                           After obtaining informed consent, the endoscope was  passed under direct vision. Throughout the                            procedure, the patient's blood pressure, pulse, and                            oxygen saturations were monitored continuously. The                            Endoscope was introduced through the mouth, and                            advanced to the second part of duodenum. The upper                            GI endoscopy was accomplished  without difficulty.                            The patient tolerated the procedure well. Scope In: Scope Out: Findings:                 The examined esophagus was normal.                           The Z-line was regular and was found 35 cm from the                            incisors.                           Localized mildly erythematous mucosa without                            bleeding was found in the gastric antrum. Biopsies                            were taken with a cold forceps for histology.                           The examined duodenum was normal. Biopsies were                            taken with a cold forceps for histology. Complications:            No immediate complications. Estimated Blood Loss:     Estimated blood loss: none. Impression:               -Mild gastritis.                           -Otherwise normal EGD. Recommendation:           - Resume previous diet.                           - Continue present medications.                           -  Await pathology results.                           - No aspirin, ibuprofen, naproxen, or other                            non-steroidal anti-inflammatory drugs. Mandy Bolognaajesh Denetria Luevanos, MD 11/06/2018 2:11:56 PM This report has been signed electronically.

## 2018-11-08 ENCOUNTER — Telehealth: Payer: Self-pay

## 2018-11-08 ENCOUNTER — Telehealth: Payer: Self-pay | Admitting: *Deleted

## 2018-11-08 NOTE — Telephone Encounter (Signed)
  Follow up Call-  Call back number 11/06/2018  Post procedure Call Back phone  # [phone]  Permission to leave phone message Yes  Some recent data might be hidden    [facility]

## 2018-11-08 NOTE — Telephone Encounter (Signed)
  Follow up Call-  Call back number 11/06/2018  Post procedure Call Back phone  # [phone]  Permission to leave phone message Yes  Some recent data might be hidden     Patient questions:  Do you have a fever, pain , or abdominal swelling? No. Pain Score  0 *  Have you tolerated food without any problems? Yes.    Have you been able to return to your normal activities? Yes.    Do you have any questions about your discharge instructions: Diet   No. Medications  No. Follow up visit  No.  Do you have questions or concerns about your Care? No.  Actions: * If pain score is 4 or above: No action needed, pain 1. Have you developed a fever since your procedure? no  2.   Have you had an respiratory symptoms (SOB or cough) since your procedure? no  3.   Have you tested positive for COVID 19 since your procedure no  4.   Have you had any family members/close contacts diagnosed with the COVID 19 since your procedure?  no   If yes to any of these questions please route to Joylene John, RN and Alphonsa Gin, Therapist, sports.

## 2018-11-12 ENCOUNTER — Encounter: Payer: Self-pay | Admitting: Gastroenterology

## 2019-06-06 ENCOUNTER — Other Ambulatory Visit: Payer: Self-pay | Admitting: Sports Medicine

## 2019-06-06 ENCOUNTER — Other Ambulatory Visit: Payer: Self-pay

## 2019-06-06 ENCOUNTER — Ambulatory Visit: Payer: PRIVATE HEALTH INSURANCE | Admitting: Sports Medicine

## 2019-06-06 ENCOUNTER — Ambulatory Visit (INDEPENDENT_AMBULATORY_CARE_PROVIDER_SITE_OTHER): Payer: PRIVATE HEALTH INSURANCE

## 2019-06-06 ENCOUNTER — Encounter: Payer: Self-pay | Admitting: Sports Medicine

## 2019-06-06 DIAGNOSIS — M79672 Pain in left foot: Secondary | ICD-10-CM | POA: Diagnosis not present

## 2019-06-06 DIAGNOSIS — M79671 Pain in right foot: Secondary | ICD-10-CM

## 2019-06-06 DIAGNOSIS — M216X1 Other acquired deformities of right foot: Secondary | ICD-10-CM

## 2019-06-06 DIAGNOSIS — M722 Plantar fascial fibromatosis: Secondary | ICD-10-CM | POA: Diagnosis not present

## 2019-06-06 DIAGNOSIS — M7742 Metatarsalgia, left foot: Secondary | ICD-10-CM | POA: Diagnosis not present

## 2019-06-06 DIAGNOSIS — M216X2 Other acquired deformities of left foot: Secondary | ICD-10-CM

## 2019-06-06 MED ORDER — TRIAMCINOLONE ACETONIDE 10 MG/ML IJ SUSP: 10.0000 mg | Freq: Once | INTRAMUSCULAR | Status: DC

## 2019-06-06 NOTE — Progress Notes (Signed)
Subjective: Mandy Grant is a 59 y.o. female patient presents to office with complaint of moderate heel pain on the right for the last month feels like she is walking on pebbles with sharp pain to the heel and admits to a past ankle injury with fracture in 2009 but no recent injury to the right foot.  Patient also reports that last February on Valentine's Day was taking out the trash can stretch her left foot and every since has had pain at the ball of the foot pain sometimes will flareup and be 8-9 out of 10 but lately it has been doing pretty good since she has rested the foot and started using her cam boot. Denies any other pedal complaints.   Review of Systems  All other systems reviewed and are negative.    Patient Active Problem List   Diagnosis Date Noted  . HNP (herniated nucleus pulposus), cervical 08/07/2013  . Herniation of cervical intervertebral disc with radiculopathy 08/07/2013    Current Outpatient Medications on File Prior to Visit  Medication Sig Dispense Refill  . Acetaminophen (TYLENOL PO) Take by mouth as needed.    . cyclobenzaprine (FLEXERIL) 10 MG tablet Take 1 tablet (10 mg total) by mouth 3 (three) times daily as needed for muscle spasms. (Patient not taking: Reported on 10/12/2018) 30 tablet 0  . diclofenac (VOLTAREN) 75 MG EC tablet Take 1 tablet (75 mg total) by mouth 2 (two) times daily. (Patient not taking: Reported on 10/12/2018) 28 tablet 0  . estradiol-norethindrone (COMBIPATCH) 0.05-0.25 MG/DAY as directed. 1 patch 2 times a week    . fluticasone (FLONASE) 50 MCG/ACT nasal spray as directed.    Marland Kitchen ipratropium-albuterol (DUONEB) 0.5-2.5 (3) MG/3ML SOLN INHALE 1 VIAL VIA NEBULIZER TWICE A DAY    . linaclotide (LINZESS) 145 MCG CAPS capsule Take 1 capsule by mouth daily.    . meloxicam (MOBIC) 15 MG tablet Take 15 mg by mouth as needed.    . NONFORMULARY OR COMPOUNDED ITEM [facility]: Antiinflammatory Cream - Diclofenac 3%, Baclofen 2%, Lidocaine 2%, apply  1-2 grams to affected area 3-4 times daily. (Patient not taking: Reported on 10/12/2018) 120 each 2  . pantoprazole (PROTONIX) 40 MG tablet Take 1 tablet (40 mg total) by mouth daily. 90 tablet 3  . Vitamin D, Ergocalciferol, (DRISDOL) 1.25 MG (50000 UT) CAPS capsule 1 capsule once a week.     Current Facility-Administered Medications on File Prior to Visit  Medication Dose Route Frequency Provider Last Rate Last Admin  . 0.9 %  sodium chloride infusion  500 mL Intravenous Once Jackquline Denmark, MD        Allergies  Allergen Reactions  . Levofloxacin Other (See Comments)  . Sulfa Antibiotics Nausea And Vomiting and Rash    Objective: Physical Exam General: The patient is alert and oriented x3 in no acute distress.  Dermatology: Skin is warm, dry and supple bilateral lower extremities. Nails 1-10 are normal. There is no erythema, edema, no eccymosis, no open lesions present. Integument is otherwise unremarkable.  Vascular: Dorsalis Pedis pulse and Posterior Tibial pulse are 2/4 bilateral. Capillary fill time is immediate to all digits.  Neurological: Grossly intact to light touch with an achilles reflex of +2/5 and a  negative Tinel's sign bilateral.  Musculoskeletal: Tenderness to palpation at the medial calcaneal tubercale and through the insertion of the plantar fascia on the right foot.  There is very minimal pain diffusely to the ball of the left.  No pain with compression of  calcaneus bilateral. No pain with tuning fork to calcaneus bilateral. No pain with calf compression bilateral. There is decreased Ankle joint range of motion bilateral. All other joints range of motion within normal limits bilateral except right ankle where she has a history of fracture. Strength 5/5 in all groups bilateral.   Gait: Unassisted, Antalgic avoid weight on right  Xray, Right/Left foot:  Normal osseous mineralization. Joint spaces preserved.  No metatarsal fracture.  No heel fracture/dislocation/boney  destruction. Calcaneal spur present with mild thickening of plantar fascia. No other soft tissue abnormalities or radiopaque foreign bodies.  Hardware intact to right ankle history of previous fracture.  Assessment and Plan: Problem List Items Addressed This Visit    None    Visit Diagnoses    Left foot pain    -  Primary   Relevant Orders   DG Foot Complete Left   Metatarsalgia, left foot       Plantar fasciitis, right       Pain of right heel       Acquired equinus deformity of both feet          -Complete examination performed.  -Xrays reviewed -Discussed with patient in detail the condition of metatarsalgia on the left and plantar fasciitis on right, how this occurs and general treatment options. Explained both conservative and surgical treatments.  -After oral consent and aseptic prep, injected a mixture containing 1 ml of 2%  plain lidocaine, 1 ml 0.5% plain marcaine, 0.5 ml of kenalog 10 and 0.5 ml of dexamethasone phosphate into right heel. Post-injection care discussed with patient.  -Recommended good supportive shoes daily -Recommend new custom orthotics office to call patient to discuss benefits - Explained in detail the use of the fascial brace for the right and metatarsal padding sleeve for the left -Explained and dispensed to patient daily stretching exercises. -Recommend patient to ice affected area 1-2x daily. -Patient to return to office in 4 weeks for follow up or sooner if problems or questions arise.  Asencion Islam, DPM

## 2019-06-19 DIAGNOSIS — H6983 Other specified disorders of Eustachian tube, bilateral: Secondary | ICD-10-CM | POA: Insufficient documentation

## 2019-06-19 DIAGNOSIS — M154 Erosive (osteo)arthritis: Secondary | ICD-10-CM | POA: Insufficient documentation

## 2019-06-28 ENCOUNTER — Encounter: Payer: Self-pay | Admitting: Sports Medicine

## 2019-06-28 ENCOUNTER — Other Ambulatory Visit: Payer: Self-pay

## 2019-06-28 ENCOUNTER — Ambulatory Visit: Payer: PRIVATE HEALTH INSURANCE | Admitting: Sports Medicine

## 2019-06-28 DIAGNOSIS — M722 Plantar fascial fibromatosis: Secondary | ICD-10-CM | POA: Diagnosis not present

## 2019-06-28 DIAGNOSIS — M216X1 Other acquired deformities of right foot: Secondary | ICD-10-CM | POA: Diagnosis not present

## 2019-06-28 DIAGNOSIS — M7742 Metatarsalgia, left foot: Secondary | ICD-10-CM

## 2019-06-28 DIAGNOSIS — M79671 Pain in right foot: Secondary | ICD-10-CM | POA: Diagnosis not present

## 2019-06-28 DIAGNOSIS — M216X2 Other acquired deformities of left foot: Secondary | ICD-10-CM

## 2019-06-28 DIAGNOSIS — M79672 Pain in left foot: Secondary | ICD-10-CM

## 2019-06-28 NOTE — Progress Notes (Signed)
Subjective: Mandy Grant is a 59 y.o. female returns to office for follow up evaluation after Right heel injection for plantar fasciitis, injection #1 administered 3 weeks ago. Patient states that the injection seems to help her pain; pain is now 2/10 and has decreased in frequency to the area.  No longer feels like she is walking on rocks pain is much better.  Patient reports that at her ankle where she has screws there is a little rubbing and she wants to discuss the possibility of having her ankle hardware removed at the medial malleolus on the right.  Patient also admits that the pain on the left is better as well and has been using the metatarsal padding pain in this area is a small aggravation from time to time at most 2 out of 10.  Patient denies any recent changes in medications or new problems since last visit.   Patient Active Problem List   Diagnosis Date Noted  . HNP (herniated nucleus pulposus), cervical 08/07/2013  . Herniation of cervical intervertebral disc with radiculopathy 08/07/2013    Current Outpatient Medications on File Prior to Visit  Medication Sig Dispense Refill  . Acetaminophen (TYLENOL PO) Take by mouth as needed.    . cyclobenzaprine (FLEXERIL) 10 MG tablet Take 1 tablet (10 mg total) by mouth 3 (three) times daily as needed for muscle spasms. (Patient not taking: Reported on 10/12/2018) 30 tablet 0  . diclofenac (VOLTAREN) 75 MG EC tablet Take 1 tablet (75 mg total) by mouth 2 (two) times daily. (Patient not taking: Reported on 10/12/2018) 28 tablet 0  . estradiol-norethindrone (COMBIPATCH) 0.05-0.25 MG/DAY as directed. 1 patch 2 times a week    . fluticasone (FLONASE) 50 MCG/ACT nasal spray as directed.    Marland Kitchen ipratropium-albuterol (DUONEB) 0.5-2.5 (3) MG/3ML SOLN INHALE 1 VIAL VIA NEBULIZER TWICE A DAY    . linaclotide (LINZESS) 145 MCG CAPS capsule Take 1 capsule by mouth daily.    . meloxicam (MOBIC) 15 MG tablet Take 15 mg by mouth as needed.    . NONFORMULARY OR  COMPOUNDED ITEM [facility]: Antiinflammatory Cream - Diclofenac 3%, Baclofen 2%, Lidocaine 2%, apply 1-2 grams to affected area 3-4 times daily. (Patient not taking: Reported on 10/12/2018) 120 each 2  . pantoprazole (PROTONIX) 40 MG tablet Take 1 tablet (40 mg total) by mouth daily. 90 tablet 3  . Vitamin D, Ergocalciferol, (DRISDOL) 1.25 MG (50000 UT) CAPS capsule 1 capsule once a week.     Current Facility-Administered Medications on File Prior to Visit  Medication Dose Route Frequency Provider Last Rate Last Admin  . 0.9 %  sodium chloride infusion  500 mL Intravenous Once Lynann Bologna, MD      . triamcinolone acetonide (KENALOG) 10 MG/ML injection 10 mg  10 mg Other Once Asencion Islam, DPM        Allergies  Allergen Reactions  . Levofloxacin Other (See Comments)  . Sulfa Antibiotics Nausea And Vomiting and Rash    Objective:   General:  Alert and oriented x 3, in no acute distress  Dermatology: Skin is warm, dry, and supple bilateral. Nails are within normal limits. There is no lower extremity erythema, no eccymosis, no open lesions present bilateral.  Old surgical scars well-healed right ankle.  Vascular: Dorsalis Pedis and Posterior Tibial pedal pulses are 2/4 bilateral. + hair growth noted bilateral. Capillary Fill Time is 3 seconds in all digits. No varicosities, No edema bilateral lower extremities.   Neurological: Gross sensation intact via light  touch bilateral.   Musculoskeletal: There minimal tenderness to the right plantar fascial insertion and ball of the left foot.  Most pain today noted at her right ankle at area of previous ankle fracture with palpable hardware at the medial malleolus.  Ankle range of motion limited on right. Strength 5/5 bilateral.   Assessment and Plan: Problem List Items Addressed This Visit    None    Visit Diagnoses    Plantar fasciitis, right    -  Primary   Pain of right heel       Acquired equinus deformity of both feet        Metatarsalgia, left foot       Left foot pain          -Complete examination performed.  -Previous x-rays reviewed. -Re-Discussed with patient in detail the condition of plantar fasciitis and metatarsalgia, how this occurs related to the foot type of the patient and general treatment options. -No reinjection at this time since patient is symptomatically much improved -Advised patient to use heel lifts as dispensed at today's visit -Continue with stretching, icing, good supportive shoes, inserts daily.  -Discussed long term care and reocurrence of fasciitis; will closely monitor; if fails to improve will consider other treatment modalities.  -Advised patient if she would like to consider her ankle hardware to be removed we would have to first get further CT imaging to assess the healing of her fracture site before we can even discuss removal of the hardware and likely this will require 1 month minimum nonweightbearing.  Patient reports that she wants to think about these options and will schedule at a later time if she decides that she wants to have this done -Patient to return to office as needed or sooner if problems or questions arise.  Landis Martins, DPM

## 2019-07-06 DIAGNOSIS — R110 Nausea: Secondary | ICD-10-CM | POA: Insufficient documentation

## 2019-08-08 ENCOUNTER — Other Ambulatory Visit: Payer: Self-pay | Admitting: Sports Medicine

## 2019-08-08 DIAGNOSIS — M722 Plantar fascial fibromatosis: Secondary | ICD-10-CM

## 2019-08-20 DIAGNOSIS — M25562 Pain in left knee: Secondary | ICD-10-CM | POA: Insufficient documentation

## 2019-08-22 ENCOUNTER — Ambulatory Visit (INDEPENDENT_AMBULATORY_CARE_PROVIDER_SITE_OTHER): Payer: PRIVATE HEALTH INSURANCE | Admitting: Neurology

## 2019-08-22 ENCOUNTER — Telehealth: Payer: Self-pay | Admitting: Neurology

## 2019-08-22 ENCOUNTER — Other Ambulatory Visit: Payer: Self-pay

## 2019-08-22 ENCOUNTER — Encounter: Payer: Self-pay | Admitting: Neurology

## 2019-08-22 VITALS — BP 129/84 | HR 88 | Ht 62.0 in | Wt 188.0 lb

## 2019-08-22 DIAGNOSIS — G43709 Chronic migraine without aura, not intractable, without status migrainosus: Secondary | ICD-10-CM | POA: Diagnosis not present

## 2019-08-22 DIAGNOSIS — M542 Cervicalgia: Secondary | ICD-10-CM

## 2019-08-22 DIAGNOSIS — IMO0002 Reserved for concepts with insufficient information to code with codable children: Secondary | ICD-10-CM

## 2019-08-22 DIAGNOSIS — F419 Anxiety disorder, unspecified: Secondary | ICD-10-CM

## 2019-08-22 DIAGNOSIS — R519 Headache, unspecified: Secondary | ICD-10-CM | POA: Diagnosis not present

## 2019-08-22 MED ORDER — UBRELVY 50 MG PO TABS: 50.0000 mg | ORAL_TABLET | ORAL | 6 refills | Status: DC | PRN

## 2019-08-22 MED ORDER — VENLAFAXINE HCL ER 37.5 MG PO CP24
37.5000 mg | ORAL_CAPSULE | Freq: Every day | ORAL | 11 refills | Status: DC
Start: 2019-08-22 — End: 2019-09-14
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
## 2019-08-22 NOTE — Telephone Encounter (Signed)
cigna order sent to [facility]. They will obtain the auth and reach out to the patient to schedule.  °

## 2019-08-29 ENCOUNTER — Telehealth: Payer: Self-pay | Admitting: Neurology

## 2019-08-29 NOTE — Telephone Encounter (Signed)
I reached out to the pt and advised of MRI denial. Pt will proceed with MRI of the brain.  She sts she thought her last cervical spine MRI was in 2015 and did not understand why her insurance denied the scan.  Pt was advised of the reason we received and sts she will call her insurance to get further clarification on this.  Brain MRI is scheduled for this coming Saturday.

## 2019-08-29 NOTE — Telephone Encounter (Signed)
mri brain [id number] was approved but the mri cervical  [phone] was denied for following reason: Based on [facility] [facility] Guidelines Section(s): SP 15.1 Greater than Six Months Post-Operative and 1.0 General Guidelines, we cannot approve this request. Your records show that you have had a repair (surgery) to treat a problem with your spine more than six months ago. The reason this request cannot be approved is because: There is no record that you had contact (in person, by phone, by mail, or by messaging) with your doctor after a recent (within three months) six week trial of treatment that failed to improve your symptoms. You have not completed six weeks of treatment directed by your doctor. You must provide results of an x-ray taken after your symptoms started or changed that show a need for further imaging. mri [id number] was approved but the mri [id number] was denied for following reason: Based on [facility] [facility] Guidelines Section(s): SP 15.1 Greater than Six Months Post-Operative and 1.0 General Guidelines, we cannot approve this request. Your records show that you have had a repair (surgery) to treat a problem with your spine more than six months ago. The reason this request cannot be approved is because: There is no record that you had contact (in person, by phone, by mail, or by messaging) with your doctor after a recent (within three months) six week trial of treatment that failed to improve your symptoms. You have not completed six weeks of treatment directed by your doctor. You must provide results of an x-ray taken after your symptoms started or changed that show a need for further imaging.  There is an option to do a peer to peer. The phone number is [phone] and the case number is [id number]. It does need to be scheduled.

## 2019-08-29 NOTE — Telephone Encounter (Signed)
Pt states she just spoke with [facility] re: her upcoming MRI and there is something that Dr Terrace Arabia needs to do as it relates to pt's upcoming MRI.  Please call

## 2019-08-29 NOTE — Telephone Encounter (Signed)
Please let patient know above information, will do MRI of the brain, may consider MRI of cervical spine later

## 2019-08-29 NOTE — Telephone Encounter (Addendum)
I called the pt. She sts [facility] advised her on the appeal that could be completed if Dr. Terrace Arabia wanted to. I explained at this time Dr. Terrace Arabia did not want to move fwd with the appeal and would like to proceed with MRI of the brain. Pt again stated she did not understand why the MRI of the cervical spine was not approved.   Pt was advised we would be in touch with her once the result of the MRI of brain were finalized. Pt verbalized understanding and had no further questions/concerns.

## 2019-08-29 NOTE — Telephone Encounter (Signed)
Noted, I informed Taron at [facility].

## 2019-09-01 ENCOUNTER — Other Ambulatory Visit: Payer: PRIVATE HEALTH INSURANCE

## 2019-09-06 ENCOUNTER — Ambulatory Visit: Payer: PRIVATE HEALTH INSURANCE | Admitting: Neurology

## 2019-09-14 ENCOUNTER — Other Ambulatory Visit: Payer: Self-pay | Admitting: Neurology

## 2019-09-17 ENCOUNTER — Ambulatory Visit
Admission: RE | Admit: 2019-09-17 | Discharge: 2019-09-17 | Disposition: A | Discharge status: Home / Self Care | Payer: PRIVATE HEALTH INSURANCE | Source: Ambulatory Visit | Attending: Neurology | Admitting: Neurology

## 2019-09-17 ENCOUNTER — Other Ambulatory Visit: Payer: Self-pay

## 2019-09-17 DIAGNOSIS — F419 Anxiety disorder, unspecified: Secondary | ICD-10-CM

## 2019-09-17 DIAGNOSIS — G43709 Chronic migraine without aura, not intractable, without status migrainosus: Secondary | ICD-10-CM | POA: Diagnosis not present

## 2019-09-17 DIAGNOSIS — IMO0002 Reserved for concepts with insufficient information to code with codable children: Secondary | ICD-10-CM

## 2019-09-17 DIAGNOSIS — R519 Headache, unspecified: Secondary | ICD-10-CM

## 2019-09-17 DIAGNOSIS — M542 Cervicalgia: Secondary | ICD-10-CM

## 2019-09-22 ENCOUNTER — Other Ambulatory Visit: Payer: PRIVATE HEALTH INSURANCE

## 2019-10-08 ENCOUNTER — Ambulatory Visit: Payer: PRIVATE HEALTH INSURANCE | Admitting: Neurology

## 2019-10-18 ENCOUNTER — Ambulatory Visit: Payer: PRIVATE HEALTH INSURANCE | Admitting: Neurology

## 2019-11-28 ENCOUNTER — Encounter: Payer: Self-pay | Admitting: Neurology

## 2019-11-28 ENCOUNTER — Ambulatory Visit (INDEPENDENT_AMBULATORY_CARE_PROVIDER_SITE_OTHER): Payer: PRIVATE HEALTH INSURANCE | Admitting: Neurology

## 2019-11-28 VITALS — BP 120/78 | HR 83 | Ht 62.0 in | Wt 190.0 lb

## 2019-11-28 DIAGNOSIS — F419 Anxiety disorder, unspecified: Secondary | ICD-10-CM

## 2019-11-28 DIAGNOSIS — G43709 Chronic migraine without aura, not intractable, without status migrainosus: Secondary | ICD-10-CM

## 2019-11-28 MED ORDER — VENLAFAXINE HCL ER 75 MG PO CP24: 75.0000 mg | ORAL_CAPSULE | Freq: Every day | ORAL | 4 refills | Status: DC

## 2019-11-28 MED ORDER — ONDANSETRON 4 MG PO TBDP: 4.0000 mg | ORAL_TABLET | Freq: Three times a day (TID) | ORAL | 6 refills | Status: DC | PRN

## 2019-11-28 MED ORDER — GABAPENTIN 100 MG PO CAPS
300.0000 mg | ORAL_CAPSULE | Freq: Every day | ORAL | 4 refills | Status: DC
Start: 2019-11-28 — End: 2020-08-04

## 2019-11-28 MED ORDER — RIZATRIPTAN BENZOATE 10 MG PO TBDP: 10.0000 mg | ORAL_TABLET | ORAL | 6 refills | Status: DC | PRN
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
## 2019-12-24 ENCOUNTER — Ambulatory Visit (HOSPITAL_COMMUNITY)
Admission: RE | Admit: 2019-12-24 | Discharge: 2019-12-24 | Disposition: A | Discharge status: Home / Self Care | Payer: PRIVATE HEALTH INSURANCE | Source: Ambulatory Visit | Attending: Surgery | Admitting: Surgery

## 2019-12-24 ENCOUNTER — Other Ambulatory Visit: Payer: Self-pay

## 2019-12-24 ENCOUNTER — Other Ambulatory Visit (HOSPITAL_COMMUNITY): Payer: Self-pay | Admitting: Specialist

## 2019-12-24 DIAGNOSIS — M79662 Pain in left lower leg: Secondary | ICD-10-CM | POA: Diagnosis present

## 2019-12-24 DIAGNOSIS — M7989 Other specified soft tissue disorders: Secondary | ICD-10-CM | POA: Insufficient documentation

## 2019-12-27 ENCOUNTER — Ambulatory Visit: Payer: PRIVATE HEALTH INSURANCE | Admitting: Neurology

## 2020-01-07 ENCOUNTER — Telehealth: Payer: Self-pay | Admitting: Sports Medicine

## 2020-01-07 DIAGNOSIS — M7122 Synovial cyst of popliteal space [Baker], left knee: Secondary | ICD-10-CM | POA: Insufficient documentation

## 2020-01-07 DIAGNOSIS — M7121 Synovial cyst of popliteal space [Baker], right knee: Secondary | ICD-10-CM | POA: Insufficient documentation

## 2020-01-07 NOTE — Telephone Encounter (Signed)
Pt is calling because she is having severe pain her right foot. She thinks its her plantars fasc and she is requesting an injection. Pt is wanting to be squeezed in somewhere. She will not be able to go the week with the pain.

## 2020-01-07 NOTE — Telephone Encounter (Signed)
9am tomorrow 12/21 is all I have available Thanks Dr. Kathie Rhodes

## 2020-01-12 ENCOUNTER — Other Ambulatory Visit: Payer: Self-pay | Admitting: Gastroenterology

## 2020-01-14 NOTE — Telephone Encounter (Signed)
Patient needs OV for additional refills.  Please call the office

## 2020-02-12 DIAGNOSIS — M25562 Pain in left knee: Secondary | ICD-10-CM | POA: Insufficient documentation

## 2020-04-07 ENCOUNTER — Other Ambulatory Visit: Payer: Self-pay | Admitting: Gastroenterology

## 2020-04-11 ENCOUNTER — Other Ambulatory Visit: Payer: Self-pay

## 2020-04-11 ENCOUNTER — Ambulatory Visit (INDEPENDENT_AMBULATORY_CARE_PROVIDER_SITE_OTHER): Payer: PRIVATE HEALTH INSURANCE

## 2020-04-11 ENCOUNTER — Ambulatory Visit: Payer: PRIVATE HEALTH INSURANCE | Admitting: Sports Medicine

## 2020-04-11 ENCOUNTER — Encounter: Payer: Self-pay | Admitting: Sports Medicine

## 2020-04-11 DIAGNOSIS — M79671 Pain in right foot: Secondary | ICD-10-CM

## 2020-04-11 DIAGNOSIS — M779 Enthesopathy, unspecified: Secondary | ICD-10-CM | POA: Diagnosis not present

## 2020-04-11 DIAGNOSIS — M722 Plantar fascial fibromatosis: Secondary | ICD-10-CM

## 2020-04-11 DIAGNOSIS — M778 Other enthesopathies, not elsewhere classified: Secondary | ICD-10-CM | POA: Diagnosis not present

## 2020-04-11 MED ORDER — TRIAMCINOLONE ACETONIDE 10 MG/ML IJ SUSP
10.0000 mg | Freq: Once | INTRAMUSCULAR | Status: AC
  Administered 2020-04-11: 10 mg

## 2020-04-11 MED ORDER — PREDNISONE 5 MG (21) PO TBPK: ORAL_TABLET | ORAL | 0 refills | Status: DC

## 2020-04-11 NOTE — Progress Notes (Signed)
Subjective: Mandy Grant is a 60 y.o. female patient who presents to office for evaluation of right dorsal foot pain. Patient complains of progressive pain especially over the last several months slowly getting worse reports that she thought it would get better but then was having issues also with her knees and other joints and the pain did not improve.  Hurts to walk especially when she tries to flex her foot and admits to a previous history of breaking her ankle in 2009.  Patient denies any recent injury or trauma.  Patient reports that she also wants to be assessed for custom orthotics as well previous history of plantar fasciitis seems to be prematurely resolved with flareups every now and again but wants to get orthotics as previously discussed from the past visit.  No other pedal complaints noted.  Patient Active Problem List   Diagnosis Date Noted  . Chronic migraine w/o aura w/o status migrainosus, not intractable 11/28/2019  . Chronic migraine 08/22/2019  . Anxiety 08/22/2019  . Neck pain 08/22/2019  . Persistent headaches 08/22/2019  . HNP (herniated nucleus pulposus), cervical 08/07/2013  . Herniation of cervical intervertebral disc with radiculopathy 08/07/2013    Current Outpatient Medications on File Prior to Visit  Medication Sig Dispense Refill  . butalbital-acetaminophen-caffeine (FIORICET) 50-325-40 MG tablet butalbital-acetaminophen-caffeine 50 mg-325 mg-40 mg tablet  TAKE 1 TABLET BY MOUTH EVERY 6 HOURS AS NEEDED    . DULoxetine (CYMBALTA) 30 MG capsule Take 1 tablet by mouth daily.    . Acetaminophen (TYLENOL PO) Take by mouth as needed.    Marland Kitchen albuterol (VENTOLIN HFA) 108 (90 Base) MCG/ACT inhaler Inhale into the lungs.    . benzonatate (TESSALON) 100 MG capsule benzonatate 100 mg capsule    . cefdinir (OMNICEF) 300 MG capsule     . cephALEXin (KEFLEX) 500 MG capsule cephalexin 500 mg capsule    . estradiol-norethindrone (COMBIPATCH) 0.05-0.25 MG/DAY as directed. 1 patch  2 times a week    . ezetimibe (ZETIA) 10 MG tablet Take 10 mg by mouth daily.    . fluticasone (FLONASE) 50 MCG/ACT nasal spray as needed.     . gabapentin (NEURONTIN) 100 MG capsule Take 3 capsules (300 mg total) by mouth at bedtime. 90 capsule 4  . GELSYN-3 16.8 MG/2ML SOSY     . guaiFENesin-codeine 100-10 MG/5ML syrup Take by mouth.    Marland Kitchen HYDROcodone-acetaminophen (NORCO/VICODIN) 5-325 MG tablet Take by mouth.    . hydroxychloroquine (PLAQUENIL) 200 MG tablet Take by mouth daily.    Marland Kitchen linaclotide (LINZESS) 145 MCG CAPS capsule Take 1 capsule by mouth as needed.     . magnesium oxide (MAG-OX) 400 MG tablet Take 400 mg by mouth daily.    . methocarbamol (ROBAXIN) 500 MG tablet methocarbamol 500 mg tablet    . ondansetron (ZOFRAN ODT) 4 MG disintegrating tablet Take 1 tablet (4 mg total) by mouth every 8 (eight) hours as needed. 20 tablet 6  . ondansetron (ZOFRAN) 4 MG tablet Take by mouth.    . oxyCODONE (OXY IR/ROXICODONE) 5 MG immediate release tablet oxycodone 5 mg tablet  Take 1 tablet every 4 hours by oral route.    . pantoprazole (PROTONIX) 40 MG tablet TAKE 1 TABLET BY MOUTH EVERY DAY 90 tablet 0  . Riboflavin 100 MG CAPS Take by mouth.    . rizatriptan (MAXALT-MLT) 10 MG disintegrating tablet Take 1 tablet (10 mg total) by mouth as needed. May repeat in 2 hours if needed 15 tablet 6  .  Ubrogepant (UBRELVY) 50 MG TABS Take 50 mg by mouth as needed. 12 tablet 6  . venlafaxine XR (EFFEXOR XR) 75 MG 24 hr capsule Take 1 capsule (75 mg total) by mouth daily with breakfast. 90 capsule 4  . Vitamin D, Ergocalciferol, (DRISDOL) 1.25 MG (50000 UT) CAPS capsule 1 capsule once a week.     No current facility-administered medications on file prior to visit.    Allergies  Allergen Reactions  . Levofloxacin Other (See Comments)  . Sulfa Antibiotics Nausea And Vomiting and Rash    Objective:  General: Alert and oriented x3 in no acute distress  Dermatology: No open lesions bilateral lower  extremities, no webspace macerations, no ecchymosis bilateral, all nails x 10 are well manicured.  Vascular: Dorsalis Pedis and Posterior Tibial pedal pulses palpable, Capillary Fill Time 3 seconds,(+) pedal hair growth bilateral, no edema bilateral lower extremities, Temperature gradient within normal limits.  Neurology: Michaell Cowing sensation intact via light touch bilateral.   Musculoskeletal: Mild tenderness with palpation at extensor tendons and dorsal lateral foot over the sinus tarsi on the right foot. + pain with plantarflexion of the right foot.  Strength within normal limits with mild guarding with pain on right.  Gait: Antalgic gait  Xrays  Right foot   Impression: No acute osseous findings hardware at ankle intact.  Assessment and Plan: Problem List Items Addressed This Visit   None   Visit Diagnoses    Capsulitis of right foot    -  Primary   Relevant Orders   DG Foot Complete Right   Tendinitis       Right foot pain       Bilateral plantar fasciitis       Previous history        -Complete examination performed -Xrays reviewed -Discussed treatment options for possible capsulitis versus overuse tendinitis right foot  -After oral consent and aseptic prep, injected a mixture containing 1 ml of 2%  plain lidocaine, 1 ml 0.5% plain marcaine, 0.5 ml of kenalog 10 and 0.5 ml of dexamethasone phosphate into right dorsal lateral foot over the sinus tarsi and the extensor tendon complex without complication. Post-injection care discussed with patient.  -Rx prednisone Dosepak and advised patient to pick up if pain still continues on Monday  -Dispensed Tri-Lock ankle brace to use as directed -Recommend rest ice elevation until symptoms improve -Patient was casted today for custom functional foot orthotics; order sent to [facility] lab; explanation of coverage document given to patient with notification that is her responsibility to check with the insurance about cost -Patient to return to  office in 3 to 4 weeks or sooner if condition worsens.  Asencion Islam, DPM

## 2020-05-13 ENCOUNTER — Ambulatory Visit: Payer: PRIVATE HEALTH INSURANCE | Admitting: Sports Medicine

## 2020-08-03 ENCOUNTER — Other Ambulatory Visit: Payer: Self-pay | Admitting: Neurology

## 2020-08-03 ENCOUNTER — Other Ambulatory Visit: Payer: Self-pay | Admitting: Gastroenterology

## 2020-09-03 ENCOUNTER — Other Ambulatory Visit: Payer: Self-pay | Admitting: *Deleted

## 2020-09-03 MED ORDER — ONDANSETRON 4 MG PO TBDP: 4.0000 mg | ORAL_TABLET | Freq: Three times a day (TID) | ORAL | 6 refills | Status: AC | PRN

## 2020-09-03 MED ORDER — VENLAFAXINE HCL ER 150 MG PO CP24: 150.0000 mg | ORAL_CAPSULE | Freq: Every day | ORAL | 3 refills | Status: DC

## 2021-02-24 ENCOUNTER — Other Ambulatory Visit: Payer: Self-pay | Admitting: Orthopedic Surgery

## 2021-02-24 DIAGNOSIS — Z96652 Presence of left artificial knee joint: Secondary | ICD-10-CM

## 2021-03-02 ENCOUNTER — Inpatient Hospital Stay: Admission: RE | Admit: 2021-03-02 | Payer: PRIVATE HEALTH INSURANCE | Source: Ambulatory Visit

## 2021-03-02 ENCOUNTER — Other Ambulatory Visit: Payer: PRIVATE HEALTH INSURANCE

## 2021-03-03 ENCOUNTER — Ambulatory Visit: Payer: Self-pay | Admitting: Orthopedic Surgery

## 2021-03-04 ENCOUNTER — Ambulatory Visit
Admission: RE | Admit: 2021-03-04 | Discharge: 2021-03-04 | Disposition: A | Discharge status: Home / Self Care | Payer: PRIVATE HEALTH INSURANCE | Source: Ambulatory Visit | Attending: Orthopedic Surgery | Admitting: Orthopedic Surgery

## 2021-03-04 ENCOUNTER — Other Ambulatory Visit: Payer: Self-pay

## 2021-03-04 ENCOUNTER — Other Ambulatory Visit: Payer: PRIVATE HEALTH INSURANCE

## 2021-03-04 DIAGNOSIS — Z96652 Presence of left artificial knee joint: Secondary | ICD-10-CM

## 2021-03-09 ENCOUNTER — Ambulatory Visit: Payer: Self-pay | Admitting: Orthopedic Surgery

## 2021-03-09 NOTE — H&P (Signed)
TOTAL KNEE REVISION ADMISSION H&P  Patient is being admitted for left revision total knee arthroplasty.  Subjective:  Chief Complaint:left knee pain.  HPI: Mandy Grant, 61 y.o. female, has a history of pain and functional disability in the left knee(s) due to failed previous arthroplasty. The indications for the revision of the total knee arthroplasty are loosening of one or more components. Onset of symptoms was abrupt starting  postoperatively  years ago with stable course since that time.  Prior procedures on the left knee(s) include arthroplasty.  Patient currently rates pain in the left knee(s) at 10 out of 10 with activity. There is night pain, worsening of pain with activity and weight bearing, pain that interferes with activities of daily living, pain with passive range of motion, crepitus, and joint swelling.  Patient has evidence of prosthetic loosening by imaging studies. This condition presents safety issues increasing the risk of falls.  There is no current active infection.  Patient Active Problem List   Diagnosis Date Noted   Chronic migraine w/o aura w/o status migrainosus, not intractable 11/28/2019   Chronic migraine 08/22/2019   Anxiety 08/22/2019   Neck pain 08/22/2019   Persistent headaches 08/22/2019   HNP (herniated nucleus pulposus), cervical 08/07/2013   Herniation of cervical intervertebral disc with radiculopathy 08/07/2013   Past Medical History:  Diagnosis Date   Adjustment reaction with anxiety and depression    Arthritis    Chronic gastritis    Complication of anesthesia    "hard time waking up"   COVID-19 virus infection    Diabetes (HCC)    Fibromyalgia    Headache(784.0)    constant with nausea   Hypercholesteremia    IBS (irritable bowel syndrome)    Migraine    OA (osteoarthritis)     Past Surgical History:  Procedure Laterality Date   ANKLE FRACTURE SURGERY Right 09   plates screws   ANTERIOR CERVICAL DECOMP/DISCECTOMY FUSION N/A  08/07/2013   Procedure: CERVICAL SIX-SEVEN ANTERIOR CERVICAL DECOMPRESSION FUSION WITH PEEK CAGE,PLATING.;  Surgeon: Temple Pacini, MD;  Location: [facility];  Service: Neurosurgery;  Laterality: N/A;   CERVICAL DISC SURGERY  02   COLONOSCOPY  11/22/2012   Colonic polyp, status post ploypectomy. Small internal hemorrhoids. Otherwise normal colonoscopy to terminal ileum   ESOPHAGOGASTRODUODENOSCOPY  09/20/2012   Mild gastritis. Minimal scalloping od duodenal musosa of questionable importance (biopsied)   KNEE SURGERY      Current Outpatient Medications  Medication Sig Dispense Refill Last Dose   Acetaminophen (TYLENOL PO) Take by mouth as needed.      albuterol (VENTOLIN HFA) 108 (90 Base) MCG/ACT inhaler Inhale into the lungs.      benzonatate (TESSALON) 100 MG capsule benzonatate 100 mg capsule      butalbital-acetaminophen-caffeine (FIORICET) 50-325-40 MG tablet butalbital-acetaminophen-caffeine 50 mg-325 mg-40 mg tablet  TAKE 1 TABLET BY MOUTH EVERY 6 HOURS AS NEEDED      cefdinir (OMNICEF) 300 MG capsule       cephALEXin (KEFLEX) 500 MG capsule cephalexin 500 mg capsule      DULoxetine (CYMBALTA) 30 MG capsule Take 1 tablet by mouth daily.      estradiol-norethindrone (COMBIPATCH) 0.05-0.25 MG/DAY as directed. 1 patch 2 times a week      ezetimibe (ZETIA) 10 MG tablet Take 10 mg by mouth daily.      fluticasone (FLONASE) 50 MCG/ACT nasal spray as needed.       gabapentin (NEURONTIN) 100 MG capsule TAKE 3 CAPSULES BY MOUTH AT  BEDTIME. 270 capsule 1    GELSYN-3 16.8 MG/2ML SOSY       guaiFENesin-codeine 100-10 MG/5ML syrup Take by mouth.      HYDROcodone-acetaminophen (NORCO/VICODIN) 5-325 MG tablet Take by mouth.      hydroxychloroquine (PLAQUENIL) 200 MG tablet Take by mouth daily.      linaclotide (LINZESS) 145 MCG CAPS capsule Take 1 capsule by mouth as needed.       magnesium oxide (MAG-OX) 400 MG tablet Take 400 mg by mouth daily.      methocarbamol (ROBAXIN) 500 MG tablet  methocarbamol 500 mg tablet      ondansetron (ZOFRAN ODT) 4 MG disintegrating tablet Take 1 tablet (4 mg total) by mouth every 8 (eight) hours as needed. 20 tablet 6    oxyCODONE (OXY IR/ROXICODONE) 5 MG immediate release tablet oxycodone 5 mg tablet  Take 1 tablet every 4 hours by oral route.      pantoprazole (PROTONIX) 40 MG tablet TAKE 1 TABLET BY MOUTH EVERY DAY 90 tablet 0    predniSONE (STERAPRED UNI-PAK 21 TAB) 5 MG (21) TBPK tablet Take by mouth as directed. 21 tablet 0    Riboflavin 100 MG CAPS Take by mouth.      rizatriptan (MAXALT-MLT) 10 MG disintegrating tablet Take 1 tablet (10 mg total) by mouth as needed. May repeat in 2 hours if needed 15 tablet 6    Ubrogepant (UBRELVY) 50 MG TABS Take 50 mg by mouth as needed. 12 tablet 6    venlafaxine XR (EFFEXOR-XR) 150 MG 24 hr capsule Take 1 capsule (150 mg total) by mouth daily with breakfast. 90 capsule 3    Vitamin D, Ergocalciferol, (DRISDOL) 1.25 MG (50000 UT) CAPS capsule 1 capsule once a week.      No current facility-administered medications for this visit.   Allergies  Allergen Reactions   Levofloxacin Other (See Comments)   Sulfa Antibiotics Nausea And Vomiting and Rash    Social History   Tobacco Use   Smoking status: Former    Packs/day: 0.50    Years: 4.00    Pack years: 2.00    Types: Cigarettes    Quit date: 07/28/1986    Years since quitting: 34.6   Smokeless tobacco: Never  Substance Use Topics   Alcohol use: No    Family History  Problem Relation Age of Onset   Hypertension Mother    Other Father        unsure of medical history   Diabetes Sister    Prostate cancer Maternal Grandfather    Colon cancer Neg Hx    Esophageal cancer Neg Hx    Rectal cancer Neg Hx    Stomach cancer Neg Hx       Review of Systems  Musculoskeletal:  Positive for arthralgias and joint swelling.  All other systems reviewed and are negative.   Objective:  Physical Exam Constitutional:      Appearance: Normal  appearance.  HENT:     Head: Normocephalic and atraumatic.     Nose: Nose normal.     Mouth/Throat:     Mouth: Mucous membranes are moist.     Pharynx: Oropharynx is clear.  Eyes:     Extraocular Movements: Extraocular movements intact.     Conjunctiva/sclera: Conjunctivae normal.     Pupils: Pupils are equal, round, and reactive to light.  Cardiovascular:     Rate and Rhythm: Normal rate and regular rhythm.     Pulses: Normal pulses.  Pulmonary:  Effort: Pulmonary effort is normal. No respiratory distress.  Abdominal:     General: Abdomen is flat. There is no distension.     Palpations: Abdomen is soft.  Genitourinary:    Comments: deferred Musculoskeletal:     Cervical back: Normal range of motion and neck supple.     Left knee: Swelling, bony tenderness and crepitus present. Decreased range of motion. Abnormal patellar mobility.       Legs:  Skin:    General: Skin is warm and dry.     Capillary Refill: Capillary refill takes less than 2 seconds.  Neurological:     General: No focal deficit present.     Mental Status: She is alert and oriented to person, place, and time.  Psychiatric:        Mood and Affect: Mood normal.        Behavior: Behavior normal.        Thought Content: Thought content normal.        Judgment: Judgment normal.    Vital signs in last 24 hours: @VSRANGES @  Labs:  Estimated body mass index is 34.75 kg/m as calculated from the following:   Height as of 11/28/19: 5\' 2"  (1.575 m).   Weight as of 11/28/19: 86.2 kg.  Imaging Review Plain radiographs demonstrate cemented arthroplasty of the left knee(s). The overall alignment is neutral.There is evidence of loosening of the patellar components. The bone quality appears to be adequate for age and reported activity level. There is dislodgement of the patellar button into the suprapatellar pouch.    Assessment/Plan:  End stage arthritis, left knee(s) with failed previous arthroplasty.   The  patient history, physical examination, clinical judgment of the provider and imaging studies are consistent with end stage degenerative joint disease of the left knee(s), previous total knee arthroplasty. Revision total knee arthroplasty is deemed medically necessary. The treatment options including medical management, injection therapy, arthroscopy and revision arthroplasty were discussed at length. The risks and benefits of revision total knee arthroplasty were presented and reviewed. The risks due to aseptic loosening, infection, stiffness, patella tracking problems, thromboembolic complications and other imponderables were discussed. The patient acknowledged the explanation, agreed to proceed with the plan and consent was signed. Patient is being admitted for inpatient treatment for surgery, pain control, PT, OT, prophylactic antibiotics, VTE prophylaxis, progressive ambulation and ADL's and discharge planning.The patient is planning to be discharged  home with OPPT.  Plan for revision patellar component with or without poly exchange vs revision of all components.

## 2021-03-17 NOTE — Progress Notes (Signed)
? ?YOU NEED TO HAVE A COVID 19 TEST ON        03/30/2021.                            COME THRU MAIN ENTRANCE AT  HAVE A SEAT IN THE LOBBY ON THE RIGHT AS YOU COME THRU THE DOOR.  CALL [phone] AND GIVE THEM YOUR NAME AND LET THEM KNOW YOU ARE HERE FOR COVID TESTING.  ? ? Your procedure is scheduled on:  ?        04/01/2021.  ? Report to [facility] Main  Entrance ? ? Report to admitting at    0845            AM ?DO NOT BRING INSURANCE CARD, PICTURE ID OR WALLET DAY OF SURGERY.  ?  ? ? Call this number if you have problems the morning of surgery [phone]  ? ? REMEMBER: NO  SOLID FOODS , CANDY, GUM OR MINTS AFTER MIDNITE THE NITE BEFORE SURGERY .       Marland Kitchen CLEAR LIQUIDS UNTIL     0830am            DAY OF SURGERY.      PLEASE FINISH ENSURE DRINK PER SURGEON ORDER  WHICH NEEDS TO BE COMPLETED AT      0830am      MORNING OF SURGERY.   ? ? ? ? ?CLEAR LIQUID DIET ? ? ?Foods Allowed      ?WATER ?BLACK COFFEE ( SUGAR OK, NO MILK, CREAM OR CREAMER) REGULAR AND DECAF  ?TEA ( SUGAR OK NO MILK, CREAM, OR CREAMER) REGULAR AND DECAF  ?PLAIN JELLO ( NO RED)  ?FRUIT ICES ( NO RED, NO FRUIT PULP)  ?POPSICLES ( NO RED)  ?JUICE- APPLE, WHITE GRAPE AND WHITE CRANBERRY  ?SPORT DRINK LIKE GATORADE ( NO RED)  ?CLEAR BROTH ( VEGETABLE , CHICKEN OR BEEF)                                                               ? ?    ? ?BRUSH YOUR TEETH MORNING OF SURGERY AND RINSE YOUR MOUTH OUT, NO CHEWING GUM CANDY OR MINTS. ?  ? ? Take these medicines the morning of surgery with A SIP OF WATER:  inhalers as usual and bring, cymbalta, effexor, protonix  ? ? ?DO NOT TAKE ANY DIABETIC MEDICATIONS DAY OF YOUR SURGERY ?                  ?            You may not have any metal on your body including hair pins and  ?            piercings  Do not wear jewelry, make-up, lotions, powders or perfumes, deodorant ?            Do not wear nail polish on your fingernails.   ?           IF YOU ARE A FEMALE AND WANT TO SHAVE UNDER ARMS OR  LEGS PRIOR TO SURGERY YOU MUST DO SO AT LEAST 48 HOURS PRIOR TO SURGERY.  ?  Men may shave face and neck. ? ? Do not bring valuables to the hospital. [facility] IS NOT ?            RESPONSIBLE   FOR VALUABLES. ? Contacts, dentures or bridgework may not be worn into surgery. ? Leave suitcase in the car. After surgery it may be brought to your room. ? ?  ? Patients discharged the day of surgery will not be allowed to drive home. IF YOU ARE HAVING SURGERY AND GOING HOME THE SAME DAY, YOU MUST HAVE AN ADULT TO DRIVE YOU HOME AND BE WITH YOU FOR 24 HOURS. YOU MAY GO HOME BY TAXI OR UBER OR ORTHERWISE, BUT AN ADULT MUST ACCOMPANY YOU HOME AND STAY WITH YOU FOR 24 HOURS. ?  ? ?            Please read over the following fact sheets you were given: ?_____________________________________________________________________ ? ? - Preparing for Surgery ?Before surgery, you can play an important role.  Because skin is not sterile, your skin needs to be as free of germs as possible.  You can reduce the number of germs on your skin by washing with CHG (chlorahexidine gluconate) soap before surgery.  CHG is an antiseptic cleaner which kills germs and bonds with the skin to continue killing germs even after washing. ?Please DO NOT use if you have an allergy to CHG or antibacterial soaps.  If your skin becomes reddened/irritated stop using the CHG and inform your nurse when you arrive at Short Stay. ?Do not shave (including legs and underarms) for at least 48 hours prior to the first CHG shower.  You may shave your face/neck. ?Please follow these instructions carefully: ? 1.  Shower with CHG Soap the night before surgery and the  morning of Surgery. ? 2.  If you choose to wash your hair, wash your hair first as usual with your  normal  shampoo. ? 3.  After you shampoo, rinse your hair and body thoroughly to remove the  shampoo.                           4.  Use CHG as you would any other liquid soap.  You can apply  chg directly  to the skin and wash  ?                     Gently with a scrungie or clean washcloth. ? 5.  Apply the CHG Soap to your body ONLY FROM THE NECK DOWN.   Do not use on face/ open      ?                     Wound or open sores. Avoid contact with eyes, ears mouth and genitals (private parts).  ?                     Engineering geologist,  Genitals (private parts) with your normal soap. ?            6.  Wash thoroughly, paying special attention to the area where your surgery  will be performed. ? 7.  Thoroughly rinse your body with warm water from the neck down. ? 8.  DO NOT shower/wash with your normal soap after using and rinsing off  the CHG Soap. ?               9.  Pat yourself dry with a clean towel. ?           10.  Wear clean pajamas. ?           11.  Place clean sheets on your bed the night of your first shower and do not  sleep with pets. ?Day of Surgery : ?Do not apply any lotions/deodorants the morning of surgery.  Please wear clean clothes to the hospital/surgery center. ? ?FAILURE TO FOLLOW THESE INSTRUCTIONS MAY RESULT IN THE CANCELLATION OF YOUR SURGERY ?PATIENT SIGNATURE_________________________________ ? ?NURSE SIGNATURE__________________________________ ? ?________________________________________________________________________  ? ? ?           ?

## 2021-03-17 NOTE — Progress Notes (Addendum)
Anesthesia Review: ? ?PCP: DR Keturah Barre- [facility] 02/25/21  for preop  ?Cardiologist : NONE  ?Chest x-ray : ?EKG : 03/19/21  ?Echo : ?Stress test: ?Cardiac Cath :  ?Activity level:  can do a flgiht of stairs without difficulty  ?Sleep Study/ CPAP : none  ?Fasting Blood Sugar :      / Checks Blood Sugar -- times a day:   ?Blood Thinner/ Instructions /Last Dose: ?ASA / Instructions/ Last Dose :   ?02/25/21-hgba1c- 6.0  ?CVOID TST ON 03/30/21.  AT 6314HF ?? PREDIABETES PER PT - DOES NOT CHECK GLUCOSE AT HOME ON NO MEDS  ?

## 2021-03-19 ENCOUNTER — Encounter (HOSPITAL_COMMUNITY)
Admission: RE | Admit: 2021-03-19 | Discharge: 2021-03-19 | Disposition: A | Discharge status: Home / Self Care | Payer: 59 | Source: Ambulatory Visit | Attending: Orthopedic Surgery | Admitting: Orthopedic Surgery

## 2021-03-19 ENCOUNTER — Other Ambulatory Visit: Payer: Self-pay

## 2021-03-19 ENCOUNTER — Encounter (HOSPITAL_COMMUNITY): Payer: Self-pay

## 2021-03-19 VITALS — BP 130/79 | HR 80 | Temp 99.1°F | Resp 16 | Ht 62.0 in | Wt 179.0 lb

## 2021-03-19 DIAGNOSIS — Z01818 Encounter for other preprocedural examination: Secondary | ICD-10-CM | POA: Insufficient documentation

## 2021-03-19 DIAGNOSIS — Z20822 Contact with and (suspected) exposure to covid-19: Secondary | ICD-10-CM | POA: Insufficient documentation

## 2021-03-19 HISTORY — DX: Nausea with vomiting, unspecified: R11.2

## 2021-03-19 HISTORY — DX: Anxiety disorder, unspecified: F41.9

## 2021-03-19 HISTORY — DX: Prediabetes: R73.03

## 2021-03-19 HISTORY — DX: Depression, unspecified: F32.A

## 2021-03-19 HISTORY — DX: Gastro-esophageal reflux disease without esophagitis: K21.9

## 2021-03-19 HISTORY — DX: Other specified postprocedural states: Z98.890

## 2021-03-19 LAB — CBC
HCT: 42.7 % (ref 36.0–46.0)
Hemoglobin: 13.8 g/dL (ref 12.0–15.0)
MCH: 28.8 pg (ref 26.0–34.0)
MCHC: 32.3 g/dL (ref 30.0–36.0)
MCV: 89.1 fL (ref 80.0–100.0)
Platelets: 341 10*3/uL (ref 150–400)
RBC: 4.79 MIL/uL (ref 3.87–5.11)
RDW: 14.3 % (ref 11.5–15.5)
WBC: 8.1 10*3/uL (ref 4.0–10.5)
nRBC: 0 % (ref 0.0–0.2)

## 2021-03-19 LAB — BASIC METABOLIC PANEL
Anion gap: 6 (ref 5–15)
BUN: 17 mg/dL (ref 6–20)
CO2: 26 mmol/L (ref 22–32)
Calcium: 8.7 mg/dL — ABNORMAL LOW (ref 8.9–10.3)
Chloride: 106 mmol/L (ref 98–111)
Creatinine, Ser: 0.76 mg/dL (ref 0.44–1.00)
GFR, Estimated: >60 mL/min (ref >60)
Glucose, Bld: 93 mg/dL (ref 70–99)
Potassium: 4 mmol/L (ref 3.5–5.1)
Sodium: 138 mmol/L (ref 135–145)

## 2021-03-19 LAB — SURGICAL PCR SCREEN
MRSA, PCR: NEGATIVE
Staphylococcus aureus: NEGATIVE

## 2021-03-19 LAB — GLUCOSE, CAPILLARY: Glucose-Capillary: 93 mg/dL (ref 70–99)

## 2021-03-20 ENCOUNTER — Encounter (HOSPITAL_COMMUNITY): Payer: Self-pay | Admitting: Physician Assistant

## 2021-03-30 ENCOUNTER — Encounter (HOSPITAL_COMMUNITY)
Admission: RE | Admit: 2021-03-30 | Discharge: 2021-03-30 | Disposition: A | Discharge status: Home / Self Care | Payer: PRIVATE HEALTH INSURANCE | Source: Ambulatory Visit | Attending: Orthopedic Surgery | Admitting: Orthopedic Surgery

## 2021-03-30 DIAGNOSIS — Z01812 Encounter for preprocedural laboratory examination: Secondary | ICD-10-CM

## 2021-04-01 ENCOUNTER — Encounter (HOSPITAL_COMMUNITY): Admission: RE | Payer: Self-pay | Source: Ambulatory Visit

## 2021-04-01 ENCOUNTER — Ambulatory Visit (HOSPITAL_COMMUNITY)
Admission: RE | Admit: 2021-04-01 | Payer: PRIVATE HEALTH INSURANCE | Source: Ambulatory Visit | Admitting: Orthopedic Surgery

## 2021-04-01 SURGERY — TOTAL KNEE ARTHROPLASTY WITH REVISION COMPONENTS
Anesthesia: Spinal | Site: Knee | Laterality: Left

## 2021-04-03 ENCOUNTER — Ambulatory Visit: Payer: Self-pay | Admitting: Student

## 2021-05-01 ENCOUNTER — Other Ambulatory Visit (HOSPITAL_COMMUNITY): Payer: PRIVATE HEALTH INSURANCE

## 2021-05-05 NOTE — Patient Instructions (Addendum)
DUE TO COVID-19 ONLY TWO VISITORS  (aged 60 and older)  IS ALLOWED TO COME WITH YOU AND STAY IN THE WAITING ROOM ONLY DURING PRE OP AND PROCEDURE.   ?**NO VISITORS ARE ALLOWED IN THE SHORT STAY AREA OR RECOVERY ROOM!!** ? ?IF YOU WILL BE ADMITTED INTO THE HOSPITAL YOU ARE ALLOWED ONLY FOUR SUPPORT PEOPLE DURING VISITATION HOURS ONLY (7 AM -8PM)   ?The support person(s) must pass our screening, gel in and out ?Visitors GUEST BADGE MUST BE WORN VISIBLY  ?One adult visitor may remain with you overnight and MUST be in the room by 8 P.M.  ? ?You are not required to quarantine ?Hand Hygiene often ?Do NOT share personal items ?Notify your provider if you are in close contact with someone who has COVID or you develop fever 100.4 or greater, new onset of sneezing, cough, sore throat, shortness of breath or body aches. ? ?     ? Your procedure is scheduled on:  05-14-21 ? ? Report to [facility] Main Entrance ? ?  Report to admitting at 5:15 AM ? ? Call this number if you have problems the morning of surgery [phone] ? ? Do not eat food :After Midnight. ? ? After Midnight you may have the following liquids until 4:30 AM/ PM DAY OF SURGERY ? ?Water ?Black Coffee (sugar ok, NO MILK/CREAM OR CREAMERS)  ?Tea (sugar ok, NO MILK/CREAM OR CREAMERS) regular and decaf                             ?Plain Jell-O (NO RED)                                           ?Fruit ices (not with fruit pulp, NO RED)                                     ?Popsicles (NO RED)                                                                  ?Juice: apple, WHITE grape, WHITE cranberry ?Sports drinks like Gatorade (NO RED) ?Clear broth(vegetable,chicken,beef) ? ?             ?Drink 1 G2 drink AT 4:30 AM/ the morning of surgery. ? ?  ?  ?The day of surgery:  ?Drink ONE (1) Pre-Surgery G2 at 4:30 AM the morning of surgery. Drink in one sitting. Do not sip.  ?This drink was given to you during your hospital  ?pre-op appointment visit. ?Nothing else  to drink after completing the Pre-Surgery G2. ?  ?       If you have questions, please contact your surgeon?s office. ? ? ?FOLLOW  ANY ADDITIONAL PRE OP INSTRUCTIONS YOU RECEIVED FROM YOUR SURGEON'S OFFICE!!! ?  ?  ?Oral Hygiene is also important to reduce your risk of infection.                                    ?  Remember - BRUSH YOUR TEETH THE MORNING OF SURGERY WITH YOUR REGULAR TOOTHPASTE ? ? Do NOT smoke after Midnight ? ? Take these medicines the morning of surgery with A SIP OF WATER: Ezetimbe, Pantoprazole, Venlafaxine.  Ondansetron if needed ?                  ?           You may not have any metal on your body including hair pins, jewelry, and body piercing ? ?           Do not wear make-up, lotions, powders, perfumes or deodorant ? ?Do not wear nail polish including gel and S&S, artificial/acrylic nails, or any other type of covering on natural nails including finger and toenails. If you have artificial nails, gel coating, etc. that needs to be removed by a nail salon please have this removed prior to surgery or surgery may need to be canceled/ delayed if the surgeon/ anesthesia feels like they are unable to be safely monitored.  ? ?Do not shave  48 hours prior to surgery.  ? ? Do not bring valuables to the hospital. [facility] IS NOT RESPONSIBLE   FOR VALUABLES. ? ? Contacts, dentures or bridgework may not be worn into surgery. ? ? Bring small overnight bag day of surgery. ? ?Special Instructions: Bring a copy of your healthcare power of attorney and living will documents         the day of surgery if you haven't scanned them before. ? ?Please read over the following fact sheets you were given: IF YOU HAVE QUESTIONS ABOUT YOUR PRE-OP INSTRUCTIONS PLEASE CALL [phone] Gwen  ? ?[facility] - Preparing for Surgery ?Before surgery, you can play an important role.  Because skin is not sterile, your skin needs to be as free of germs as possible.  You can reduce the number of germs on your skin by washing  with CHG (chlorahexidine gluconate) soap before surgery.  CHG is an antiseptic cleaner which kills germs and bonds with the skin to continue killing germs even after washing. ?Please DO NOT use if you have an allergy to CHG or antibacterial soaps.  If your skin becomes reddened/irritated stop using the CHG and inform your nurse when you arrive at Short Stay. ?Do not shave (including legs and underarms) for at least 48 hours prior to the first CHG shower.  You may shave your face/neck. ? ?Please follow these instructions carefully: ? 1.  Shower with CHG Soap the night before surgery and the  morning of surgery. ? 2.  If you choose to wash your hair, wash your hair first as usual with your normal  shampoo. ? 3.  After you shampoo, rinse your hair and body thoroughly to remove the shampoo.                            ? 4.  Use CHG as you would any other liquid soap.  You can apply chg directly to the skin and wash.  Gently with a scrungie or clean washcloth. ? 5.  Apply the CHG Soap to your body ONLY FROM THE NECK DOWN.   Do   not use on face/ open      ?                     Wound or open sores. Avoid contact with eyes, ears mouth and  genitals (private parts).  ?                     Engineering geologist,  Genitals (private parts) with your normal soap. ?            6.  Wash thoroughly, paying special attention to the area where your    surgery  will be performed. ? 7.  Thoroughly rinse your body with warm water from the neck down. ? 8.  DO NOT shower/wash with your normal soap after using and rinsing off the CHG Soap. ?               9.  Pat yourself dry with a clean towel. ?           10.  Wear clean pajamas. ?           11.  Place clean sheets on your bed the night of your first shower and do not  sleep with pets. ?Day of Surgery : ?Do not apply any lotions/deodorants the morning of surgery.  Please wear clean clothes to the hospital/surgery center. ? ?FAILURE TO FOLLOW THESE INSTRUCTIONS MAY RESULT IN THE CANCELLATION OF YOUR  SURGERY ? ?PATIENT SIGNATURE_________________________________ ? ?NURSE SIGNATURE__________________________________ ? ?________________________________________________________________________  ?  ? ?Incentive Spirometer ? ?An incentive spirometer is a tool that can help keep your lungs clear and active. This tool measures how well you are filling your lungs with each breath. Taking long deep breaths may help reverse or decrease the chance of developing breathing (pulmonary) problems (especially infection) following: ?A long period of time when you are unable to move or be active. ?BEFORE THE PROCEDURE  ?If the spirometer includes an indicator to show your best effort, your nurse or respiratory therapist will set it to a desired goal. ?If possible, sit up straight or lean slightly forward. Try not to slouch. ?Hold the incentive spirometer in an upright position. ?INSTRUCTIONS FOR USE  ?Sit on the edge of your bed if possible, or sit up as far as you can in bed or on a chair. ?Hold the incentive spirometer in an upright position. ?Breathe out normally. ?Place the mouthpiece in your mouth and seal your lips tightly around it. ?Breathe in slowly and as deeply as possible, raising the piston or the ball toward the top of the column. ?Hold your breath for 3-5 seconds or for as long as possible. Allow the piston or ball to fall to the bottom of the column. ?Remove the mouthpiece from your mouth and breathe out normally. ?Rest for a few seconds and repeat Steps 1 through 7 at least 10 times every 1-2 hours when you are awake. Take your time and take a few normal breaths between deep breaths. ?The spirometer may include an indicator to show your best effort. Use the indicator as a goal to work toward during each repetition. ?After each set of 10 deep breaths, practice coughing to be sure your lungs are clear. If you have an incision (the cut made at the time of surgery), support your incision when coughing by placing a pillow  or rolled up towels firmly against it. ?Once you are able to get out of bed, walk around indoors and cough well. You may stop using the incentive spirometer when instructed by your caregiver.  ?RIS

## 2021-05-05 NOTE — Progress Notes (Addendum)
COVID Vaccine Completed:  No ?Date COVID Vaccine completed: ?Has received booster: ?COVID vaccine manufacturer: [employer]  ? ?Date of COVID positive in last 90 days: No ? ?PCP - Keturah Barre, MD ?Cardiologist - N/A ? ?Chest x-ray - N/A ?EKG - 03-19-21 Epic ?Stress Test - N/A ?ECHO - N/A ?Cardiac Cath - N/A ?Pacemaker/ICD device last checked: ?Spinal Cord Stimulator: ? ?Bowel Prep - N/A ? ?Sleep Study -  ?CPAP -  ? ?Fasting Blood Sugar -  ?Checks Blood Sugar - does not check  ? ?Blood Thinner Instructions: N/A ?Aspirin Instructions: ?Last Dose: ? ?Activity level:   Can go up a flight of stairs and perform activities of daily living without stopping and without symptoms of chest pain or shortness of breath. ?   ?Anesthesia review:  ? ?Patient denies shortness of breath, fever, cough and chest pain at PAT appointment ? ? ?Patient verbalized understanding of instructions that were given to them at the PAT appointment. Patient was also instructed that they will need to review over the PAT instructions again at home before surgery.  ?

## 2021-05-07 ENCOUNTER — Encounter (HOSPITAL_COMMUNITY)
Admission: RE | Admit: 2021-05-07 | Discharge: 2021-05-07 | Disposition: A | Discharge status: Home / Self Care | Payer: 59 | Source: Ambulatory Visit | Attending: Orthopedic Surgery | Admitting: Orthopedic Surgery

## 2021-05-07 ENCOUNTER — Encounter (HOSPITAL_COMMUNITY): Payer: Self-pay

## 2021-05-07 ENCOUNTER — Other Ambulatory Visit: Payer: Self-pay

## 2021-05-07 VITALS — BP 144/81 | HR 98 | Temp 99.1°F | Resp 16 | Ht 62.0 in | Wt 180.8 lb

## 2021-05-07 DIAGNOSIS — Z01812 Encounter for preprocedural laboratory examination: Secondary | ICD-10-CM | POA: Insufficient documentation

## 2021-05-07 DIAGNOSIS — R7303 Prediabetes: Secondary | ICD-10-CM | POA: Diagnosis not present

## 2021-05-07 DIAGNOSIS — Z01818 Encounter for other preprocedural examination: Secondary | ICD-10-CM

## 2021-05-07 HISTORY — DX: Arthropathic psoriasis, unspecified: L40.50

## 2021-05-07 LAB — CBC
HCT: 42.3 % (ref 36.0–46.0)
Hemoglobin: 14.1 g/dL (ref 12.0–15.0)
MCH: 29.6 pg (ref 26.0–34.0)
MCHC: 33.3 g/dL (ref 30.0–36.0)
MCV: 88.9 fL (ref 80.0–100.0)
Platelets: 350 10*3/uL (ref 150–400)
RBC: 4.76 MIL/uL (ref 3.87–5.11)
RDW: 14.4 % (ref 11.5–15.5)
WBC: 8.9 10*3/uL (ref 4.0–10.5)
nRBC: 0 % (ref 0.0–0.2)

## 2021-05-07 LAB — BASIC METABOLIC PANEL
Anion gap: 6 (ref 5–15)
BUN: 15 mg/dL (ref 6–20)
CO2: 24 mmol/L (ref 22–32)
Calcium: 8.6 mg/dL — ABNORMAL LOW (ref 8.9–10.3)
Chloride: 108 mmol/L (ref 98–111)
Creatinine, Ser: 0.82 mg/dL (ref 0.44–1.00)
GFR, Estimated: >60 mL/min (ref >60)
Glucose, Bld: 107 mg/dL — ABNORMAL HIGH (ref 70–99)
Potassium: 4 mmol/L (ref 3.5–5.1)
Sodium: 138 mmol/L (ref 135–145)

## 2021-05-07 LAB — SURGICAL PCR SCREEN
MRSA, PCR: NEGATIVE
Staphylococcus aureus: NEGATIVE

## 2021-05-07 LAB — GLUCOSE, CAPILLARY: Glucose-Capillary: 121 mg/dL — ABNORMAL HIGH (ref 70–99)

## 2021-05-07 LAB — HEMOGLOBIN A1C
Hgb A1c MFr Bld: 6.1 % — ABNORMAL HIGH (ref 4.8–5.6)
Mean Plasma Glucose: 128.37 mg/dL

## 2021-05-11 IMAGING — US US ABDOMEN COMPLETE
1 series · 13 of 25 positions shown · non-contrast
Comparison: CT abdomen and pelvis September 08, 2012

CLINICAL DATA: Elevated liver enzymes with abdominal pain

EXAM:
ABDOMEN ULTRASOUND COMPLETE

[Series 1: us abdomen complete · 13 of 122 slices shown]
[im 1/122]
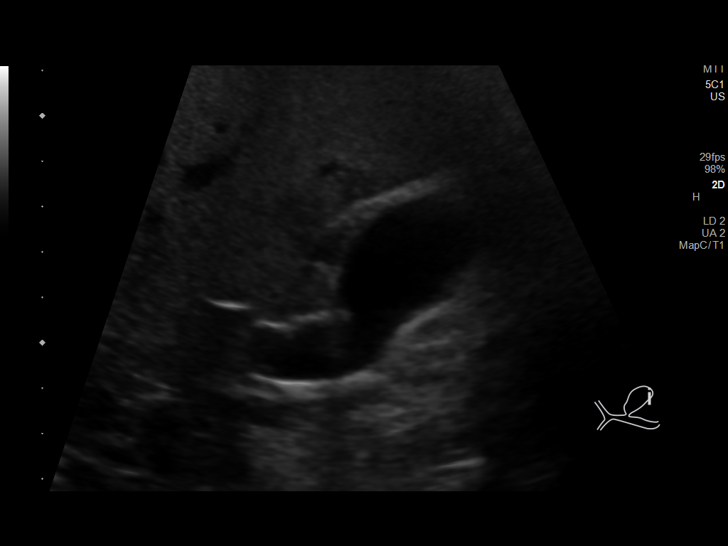
[im 11/122]
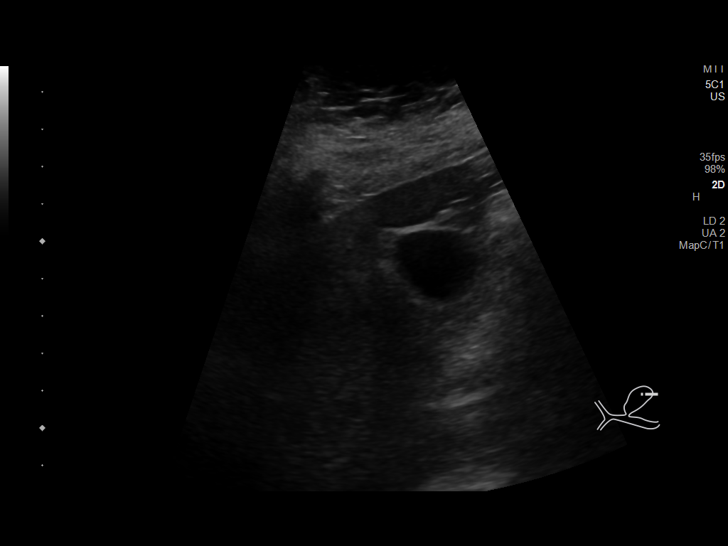
[im 21/122]
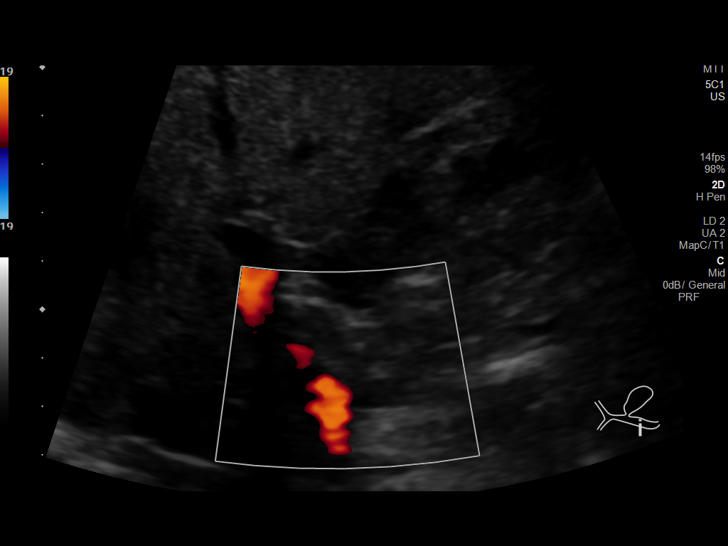
[im 31/122]
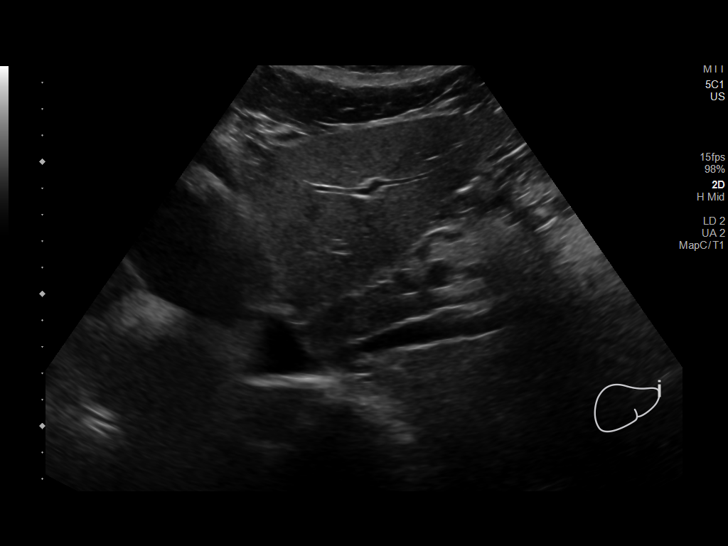
[im 41/122]
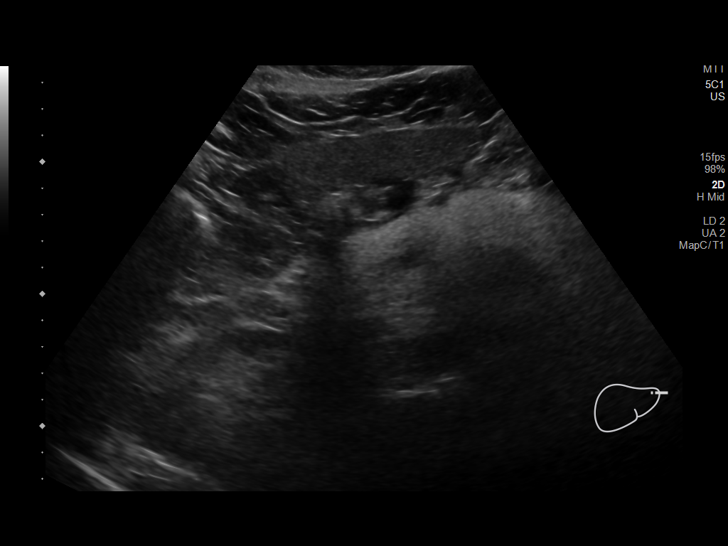
[im 51/122]
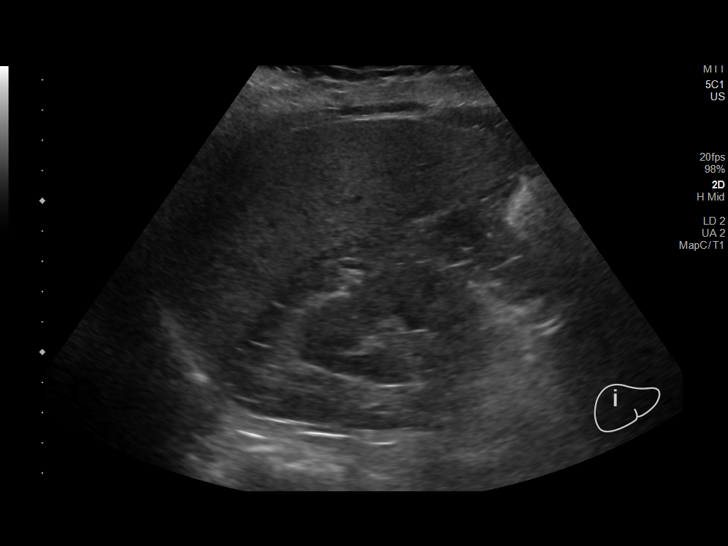
[im 61/122]
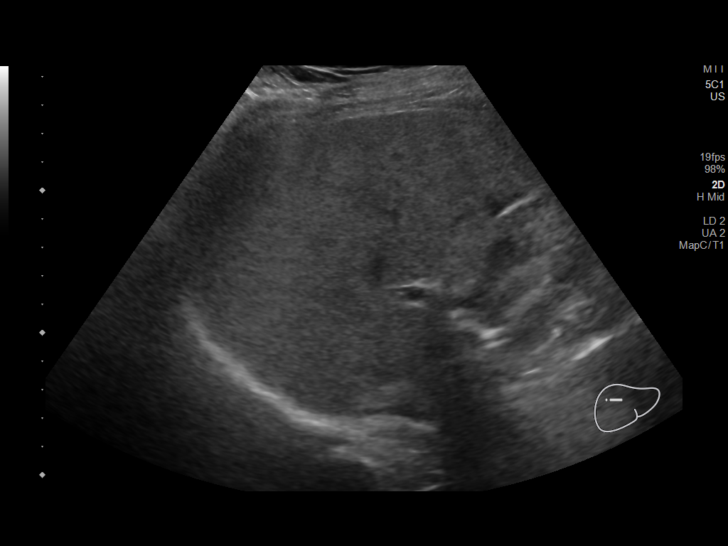
[im 71/122]
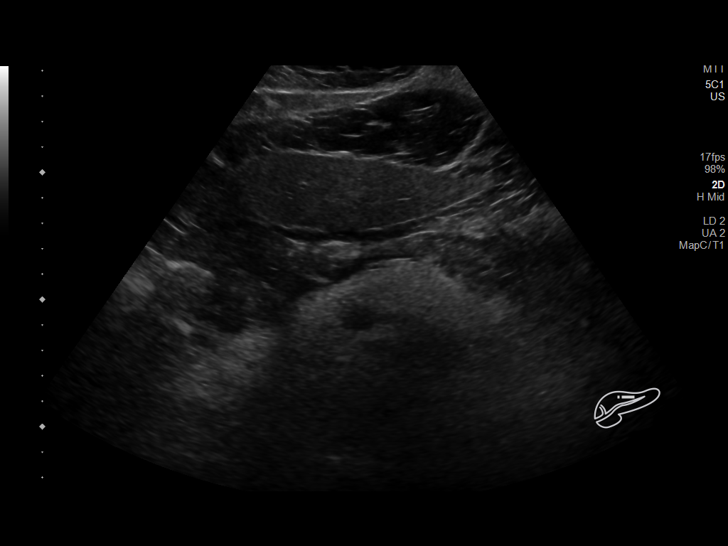
[im 81/122]
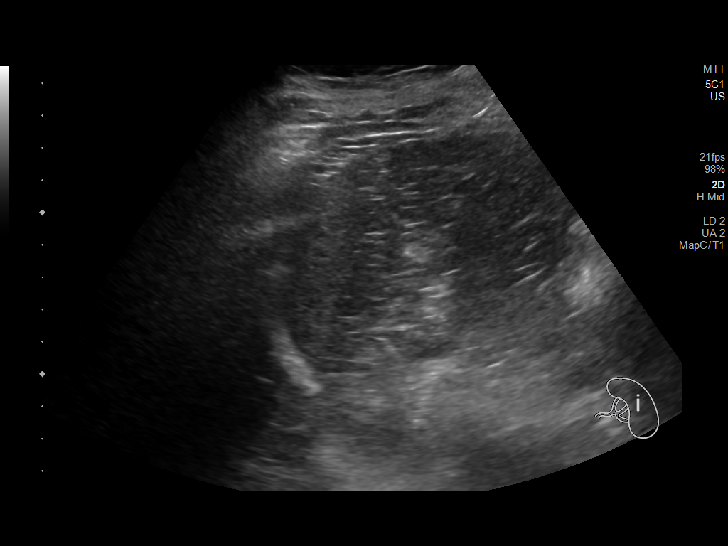
[im 91/122]
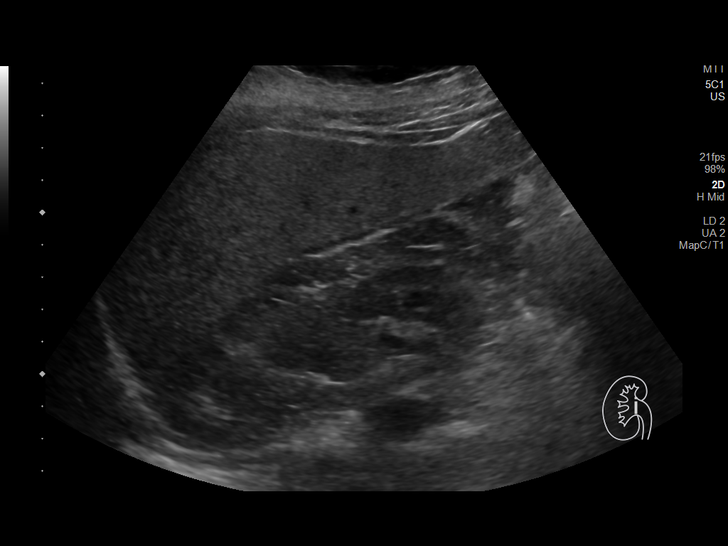
[im 101/122]
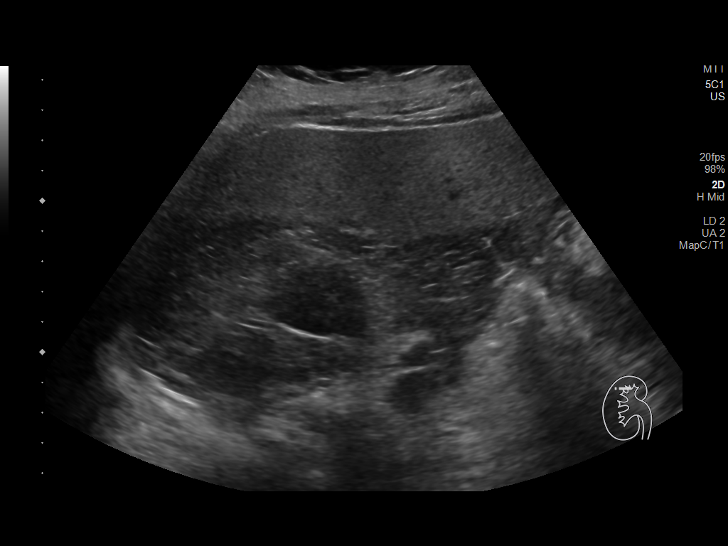
[im 111/122]
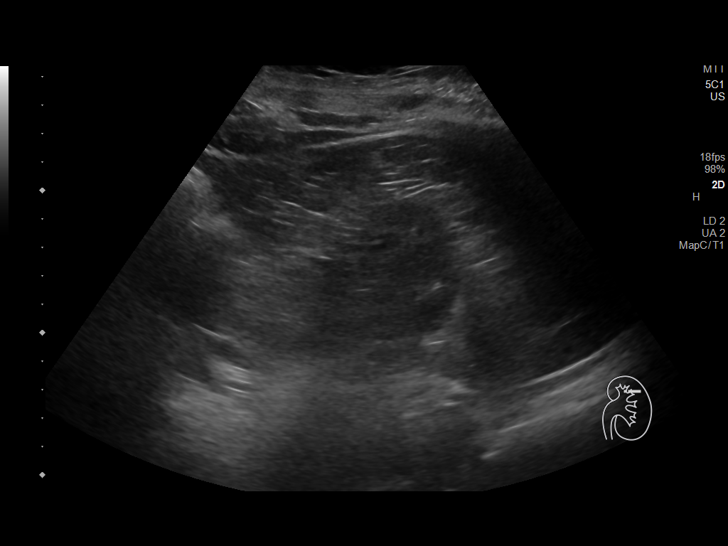
[im 122/122]
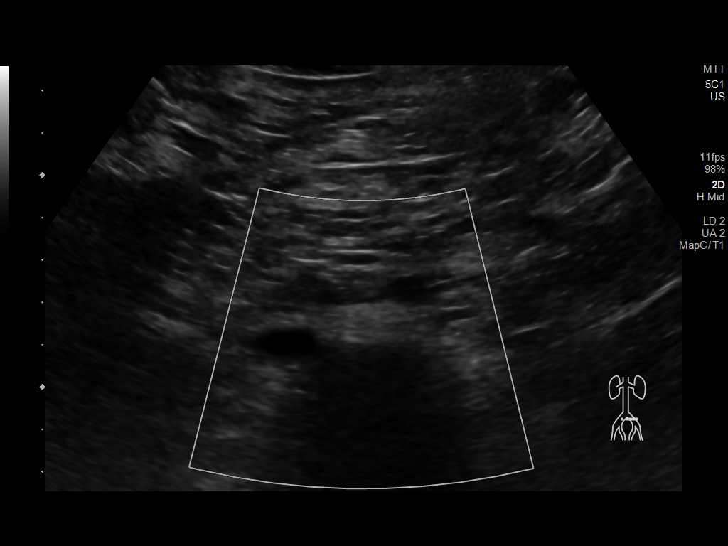

[13 of 25 positions shown; findings below may reference images not displayed]

FINDINGS: Gallbladder: No gallstones or wall thickening visualized. There is
no pericholecystic fluid. No sonographic Murphy sign noted by
sonographer.

Common bile duct: Diameter: 5 mm. No intrahepatic, common hepatic,
or common bile duct dilatation.

Liver: No focal lesion identified. Liver echogenicity overall is
increased. Portal vein is patent on color Doppler imaging with
normal direction of blood flow towards the liver.

IVC: No abnormality visualized.

Pancreas: Visualized portion unremarkable. Portions of pancreas
obscured by gas.

Spleen: Size and appearance within normal limits.

Right Kidney: Length: 9.4 cm. Echogenicity within normal limits. No
mass or hydronephrosis visualized.

Left Kidney: Length: 10.6 cm. Echogenicity within normal limits. No
mass or hydronephrosis visualized.

Abdominal aorta: No aneurysm visualized.

Other findings: No demonstrable ascites.
IMPRESSION: 1. Increase in liver echogenicity, a finding indicative of hepatic
steatosis. No focal liver lesions evident.

2. Portions of pancreas obscured by gas. Visualized portions of
pancreas appear unremarkable.

3.  Study otherwise unremarkable.

## 2021-05-12 DIAGNOSIS — M1711 Unilateral primary osteoarthritis, right knee: Secondary | ICD-10-CM | POA: Insufficient documentation

## 2021-05-14 ENCOUNTER — Ambulatory Visit (HOSPITAL_COMMUNITY): Payer: 59 | Admitting: Certified Registered Nurse Anesthetist

## 2021-05-14 ENCOUNTER — Other Ambulatory Visit: Payer: Self-pay

## 2021-05-14 ENCOUNTER — Encounter (HOSPITAL_COMMUNITY)
Admission: RE | Disposition: A | Discharge status: Home / Self Care | Payer: Self-pay | Source: Home / Self Care | Attending: Orthopedic Surgery

## 2021-05-14 ENCOUNTER — Inpatient Hospital Stay (HOSPITAL_COMMUNITY)
Admission: RE | Admit: 2021-05-14 | Discharge: 2021-05-15 | DRG: 465 | Disposition: A | Discharge status: Home / Self Care | Payer: 59 | Attending: Orthopedic Surgery | Admitting: Orthopedic Surgery

## 2021-05-14 ENCOUNTER — Inpatient Hospital Stay (HOSPITAL_COMMUNITY): Payer: 59

## 2021-05-14 ENCOUNTER — Encounter (HOSPITAL_COMMUNITY): Payer: Self-pay | Admitting: Orthopedic Surgery

## 2021-05-14 DIAGNOSIS — Z8249 Family history of ischemic heart disease and other diseases of the circulatory system: Secondary | ICD-10-CM

## 2021-05-14 DIAGNOSIS — Z981 Arthrodesis status: Secondary | ICD-10-CM

## 2021-05-14 DIAGNOSIS — M797 Fibromyalgia: Secondary | ICD-10-CM | POA: Diagnosis present

## 2021-05-14 DIAGNOSIS — Z79899 Other long term (current) drug therapy: Secondary | ICD-10-CM | POA: Diagnosis not present

## 2021-05-14 DIAGNOSIS — M1990 Unspecified osteoarthritis, unspecified site: Secondary | ICD-10-CM | POA: Diagnosis present

## 2021-05-14 DIAGNOSIS — Z6834 Body mass index (BMI) 34.0-34.9, adult: Secondary | ICD-10-CM

## 2021-05-14 DIAGNOSIS — E119 Type 2 diabetes mellitus without complications: Secondary | ICD-10-CM | POA: Diagnosis present

## 2021-05-14 DIAGNOSIS — Z882 Allergy status to sulfonamides status: Secondary | ICD-10-CM | POA: Diagnosis not present

## 2021-05-14 DIAGNOSIS — Y792 Prosthetic and other implants, materials and accessory orthopedic devices associated with adverse incidents: Secondary | ICD-10-CM | POA: Diagnosis present

## 2021-05-14 DIAGNOSIS — L4050 Arthropathic psoriasis, unspecified: Secondary | ICD-10-CM | POA: Diagnosis present

## 2021-05-14 DIAGNOSIS — Z881 Allergy status to other antibiotic agents status: Secondary | ICD-10-CM

## 2021-05-14 DIAGNOSIS — K219 Gastro-esophageal reflux disease without esophagitis: Secondary | ICD-10-CM | POA: Diagnosis present

## 2021-05-14 DIAGNOSIS — Z8616 Personal history of COVID-19: Secondary | ICD-10-CM | POA: Diagnosis not present

## 2021-05-14 DIAGNOSIS — Z833 Family history of diabetes mellitus: Secondary | ICD-10-CM

## 2021-05-14 DIAGNOSIS — G43709 Chronic migraine without aura, not intractable, without status migrainosus: Secondary | ICD-10-CM | POA: Diagnosis present

## 2021-05-14 DIAGNOSIS — F4323 Adjustment disorder with mixed anxiety and depressed mood: Secondary | ICD-10-CM | POA: Diagnosis present

## 2021-05-14 DIAGNOSIS — E7800 Pure hypercholesterolemia, unspecified: Secondary | ICD-10-CM | POA: Diagnosis present

## 2021-05-14 DIAGNOSIS — T84093A Other mechanical complication of internal left knee prosthesis, initial encounter: Principal | ICD-10-CM | POA: Diagnosis present

## 2021-05-14 DIAGNOSIS — Z87891 Personal history of nicotine dependence: Secondary | ICD-10-CM | POA: Diagnosis not present

## 2021-05-14 DIAGNOSIS — E669 Obesity, unspecified: Secondary | ICD-10-CM | POA: Diagnosis present

## 2021-05-14 DIAGNOSIS — T84013A Broken internal left knee prosthesis, initial encounter: Secondary | ICD-10-CM

## 2021-05-14 DIAGNOSIS — K589 Irritable bowel syndrome without diarrhea: Secondary | ICD-10-CM | POA: Diagnosis present

## 2021-05-14 DIAGNOSIS — Z809 Family history of malignant neoplasm, unspecified: Secondary | ICD-10-CM | POA: Diagnosis not present

## 2021-05-14 DIAGNOSIS — T84019A Broken internal joint prosthesis, unspecified site, initial encounter: Secondary | ICD-10-CM | POA: Insufficient documentation

## 2021-05-14 HISTORY — PX: TOTAL KNEE ARTHROPLASTY WITH REVISION COMPONENTS: SHX6198

## 2021-05-14 LAB — TYPE AND SCREEN
ABO/RH(D): B POS
Antibody Screen: NEGATIVE

## 2021-05-14 LAB — ABO/RH: ABO/RH(D): B POS

## 2021-05-14 SURGERY — TOTAL KNEE ARTHROPLASTY WITH REVISION COMPONENTS
Anesthesia: Regional | Site: Knee | Laterality: Left

## 2021-05-14 MED ORDER — PROPOFOL 1000 MG/100ML IV EMUL
INTRAVENOUS | Status: AC
  Filled 2021-05-14: qty 100

## 2021-05-14 MED ORDER — ALUM & MAG HYDROXIDE-SIMETH 200-200-20 MG/5ML PO SUSP: 30.0000 mL | ORAL | Status: DC | PRN

## 2021-05-14 MED ORDER — BUPIVACAINE-EPINEPHRINE (PF) 0.5% -1:200000 IJ SOLN
INTRAMUSCULAR | Status: DC | PRN
  Administered 2021-05-14: 30 mL via PERINEURAL

## 2021-05-14 MED ORDER — PANTOPRAZOLE SODIUM 40 MG PO TBEC
40.0000 mg | DELAYED_RELEASE_TABLET | Freq: Every day | ORAL | Status: DC
  Administered 2021-05-15: 40 mg via ORAL
  Filled 2021-05-14: qty 1

## 2021-05-14 MED ORDER — CHLORHEXIDINE GLUCONATE 0.12 % MT SOLN: 15.0000 mL | Freq: Once | OROMUCOSAL | Status: DC

## 2021-05-14 MED ORDER — HYDROMORPHONE HCL 1 MG/ML IJ SOLN: 0.5000 mg | INTRAMUSCULAR | Status: DC | PRN

## 2021-05-14 MED ORDER — BUPIVACAINE HCL (PF) 0.5 % IJ SOLN
INTRAMUSCULAR | Status: DC | PRN
  Administered 2021-05-14: 3 mL via INTRATHECAL

## 2021-05-14 MED ORDER — DIPHENHYDRAMINE HCL 12.5 MG/5ML PO ELIX: 12.5000 mg | ORAL_SOLUTION | ORAL | Status: DC | PRN

## 2021-05-14 MED ORDER — ONDANSETRON HCL 4 MG/2ML IJ SOLN
INTRAMUSCULAR | Status: AC
  Filled 2021-05-14: qty 2

## 2021-05-14 MED ORDER — PHENOL 1.4 % MT LIQD: 1.0000 | OROMUCOSAL | Status: DC | PRN

## 2021-05-14 MED ORDER — SENNA 8.6 MG PO TABS
1.0000 | ORAL_TABLET | Freq: Two times a day (BID) | ORAL | Status: DC
  Administered 2021-05-14 – 2021-05-15 (×2): 8.6 mg via ORAL
  Filled 2021-05-14 (×2): qty 1

## 2021-05-14 MED ORDER — POLYETHYLENE GLYCOL 3350 17 G PO PACK: 17.0000 g | PACK | Freq: Every day | ORAL | Status: DC | PRN

## 2021-05-14 MED ORDER — FLUTICASONE PROPIONATE 50 MCG/ACT NA SUSP: 1.0000 | Freq: Every day | NASAL | Status: DC | PRN

## 2021-05-14 MED ORDER — METHOCARBAMOL 500 MG IVPB - SIMPLE MED
500.0000 mg | Freq: Four times a day (QID) | INTRAVENOUS | Status: DC | PRN
  Filled 2021-05-14: qty 50

## 2021-05-14 MED ORDER — LACTATED RINGERS IR SOLN
Status: DC | PRN
  Administered 2021-05-14: 2000 mL

## 2021-05-14 MED ORDER — DEXAMETHASONE SODIUM PHOSPHATE 10 MG/ML IJ SOLN
INTRAMUSCULAR | Status: AC
  Filled 2021-05-14: qty 1

## 2021-05-14 MED ORDER — MIDAZOLAM HCL 5 MG/5ML IJ SOLN
INTRAMUSCULAR | Status: DC | PRN
Start: 2021-05-14 — End: 2021-05-14
  Administered 2021-05-14: 2 mg via INTRAVENOUS

## 2021-05-14 MED ORDER — FENTANYL CITRATE (PF) 100 MCG/2ML IJ SOLN
INTRAMUSCULAR | Status: DC | PRN
  Administered 2021-05-14 (×2): 50 ug via INTRAVENOUS

## 2021-05-14 MED ORDER — DOCUSATE SODIUM 100 MG PO CAPS
100.0000 mg | ORAL_CAPSULE | Freq: Two times a day (BID) | ORAL | Status: DC
  Administered 2021-05-14 – 2021-05-15 (×2): 100 mg via ORAL
  Filled 2021-05-14 (×2): qty 1

## 2021-05-14 MED ORDER — LACTATED RINGERS IV SOLN: INTRAVENOUS | Status: DC

## 2021-05-14 MED ORDER — GABAPENTIN 100 MG PO CAPS
200.0000 mg | ORAL_CAPSULE | Freq: Three times a day (TID) | ORAL | Status: DC
  Administered 2021-05-14 – 2021-05-15 (×3): 200 mg via ORAL
  Filled 2021-05-14 (×3): qty 2

## 2021-05-14 MED ORDER — SODIUM CHLORIDE (PF) 0.9 % IJ SOLN
INTRAMUSCULAR | Status: AC
  Filled 2021-05-14: qty 30

## 2021-05-14 MED ORDER — MENTHOL 3 MG MT LOZG: 1.0000 | LOZENGE | OROMUCOSAL | Status: DC | PRN

## 2021-05-14 MED ORDER — HYDROMORPHONE HCL 2 MG PO TABS
1.0000 mg | ORAL_TABLET | ORAL | Status: DC | PRN
  Administered 2021-05-14 – 2021-05-15 (×3): 2 mg via ORAL
  Filled 2021-05-14 (×3): qty 1

## 2021-05-14 MED ORDER — EZETIMIBE 10 MG PO TABS
10.0000 mg | ORAL_TABLET | Freq: Every day | ORAL | Status: DC
  Administered 2021-05-15: 10 mg via ORAL
  Filled 2021-05-14: qty 1

## 2021-05-14 MED ORDER — POLYVINYL ALCOHOL 1.4 % OP SOLN: 1.0000 [drp] | OPHTHALMIC | Status: DC | PRN

## 2021-05-14 MED ORDER — AMISULPRIDE (ANTIEMETIC) 5 MG/2ML IV SOLN
INTRAVENOUS | Status: AC
  Filled 2021-05-14: qty 4

## 2021-05-14 MED ORDER — KETOROLAC TROMETHAMINE 30 MG/ML IJ SOLN
INTRAMUSCULAR | Status: DC | PRN
  Administered 2021-05-14: 30 mg

## 2021-05-14 MED ORDER — FENTANYL CITRATE (PF) 100 MCG/2ML IJ SOLN
INTRAMUSCULAR | Status: AC
  Filled 2021-05-14: qty 2

## 2021-05-14 MED ORDER — KETOROLAC TROMETHAMINE 15 MG/ML IJ SOLN
15.0000 mg | Freq: Four times a day (QID) | INTRAMUSCULAR | Status: AC
  Administered 2021-05-14 – 2021-05-15 (×4): 15 mg via INTRAVENOUS
  Filled 2021-05-14 (×4): qty 1

## 2021-05-14 MED ORDER — ORAL CARE MOUTH RINSE: 15.0000 mL | Freq: Once | OROMUCOSAL | Status: DC

## 2021-05-14 MED ORDER — VENLAFAXINE HCL ER 75 MG PO CP24
225.0000 mg | ORAL_CAPSULE | Freq: Every day | ORAL | Status: DC
  Administered 2021-05-15: 225 mg via ORAL
  Filled 2021-05-14: qty 3

## 2021-05-14 MED ORDER — HYDROMORPHONE HCL 2 MG PO TABS: 2.0000 mg | ORAL_TABLET | ORAL | Status: DC | PRN

## 2021-05-14 MED ORDER — ACETAMINOPHEN 325 MG PO TABS: 325.0000 mg | ORAL_TABLET | Freq: Four times a day (QID) | ORAL | Status: DC | PRN

## 2021-05-14 MED ORDER — CHLORHEXIDINE GLUCONATE 0.12 % MT SOLN
15.0000 mL | Freq: Once | OROMUCOSAL | Status: AC
  Administered 2021-05-14: 15 mL via OROMUCOSAL

## 2021-05-14 MED ORDER — POVIDONE-IODINE 10 % EX SWAB: 2.0000 "application " | Freq: Once | CUTANEOUS | Status: DC

## 2021-05-14 MED ORDER — FENTANYL CITRATE PF 50 MCG/ML IJ SOSY: 25.0000 ug | PREFILLED_SYRINGE | INTRAMUSCULAR | Status: DC | PRN

## 2021-05-14 MED ORDER — METOCLOPRAMIDE HCL 5 MG/ML IJ SOLN: 5.0000 mg | Freq: Three times a day (TID) | INTRAMUSCULAR | Status: DC | PRN

## 2021-05-14 MED ORDER — ONDANSETRON HCL 4 MG PO TABS
4.0000 mg | ORAL_TABLET | Freq: Four times a day (QID) | ORAL | Status: DC | PRN
  Administered 2021-05-14 – 2021-05-15 (×2): 4 mg via ORAL
  Filled 2021-05-14 (×2): qty 1

## 2021-05-14 MED ORDER — SODIUM CHLORIDE 0.9 % IV SOLN: INTRAVENOUS | Status: DC

## 2021-05-14 MED ORDER — AMISULPRIDE (ANTIEMETIC) 5 MG/2ML IV SOLN
10.0000 mg | Freq: Once | INTRAVENOUS | Status: AC | PRN
  Administered 2021-05-14: 10 mg via INTRAVENOUS

## 2021-05-14 MED ORDER — SODIUM CHLORIDE (PF) 0.9 % IJ SOLN
INTRAMUSCULAR | Status: DC | PRN
  Administered 2021-05-14: 30 mL

## 2021-05-14 MED ORDER — SUMATRIPTAN SUCCINATE 50 MG PO TABS
50.0000 mg | ORAL_TABLET | ORAL | Status: DC | PRN
Start: 2021-05-14 — End: 2021-05-15
  Filled 2021-05-14: qty 1

## 2021-05-14 MED ORDER — CEFAZOLIN SODIUM-DEXTROSE 2-4 GM/100ML-% IV SOLN
2.0000 g | Freq: Four times a day (QID) | INTRAVENOUS | Status: AC
  Administered 2021-05-14 (×2): 2 g via INTRAVENOUS
  Filled 2021-05-14 (×2): qty 100

## 2021-05-14 MED ORDER — PROPOFOL 500 MG/50ML IV EMUL
INTRAVENOUS | Status: DC | PRN
  Administered 2021-05-14: 100 ug/kg/min via INTRAVENOUS

## 2021-05-14 MED ORDER — ORAL CARE MOUTH RINSE: 15.0000 mL | Freq: Once | OROMUCOSAL | Status: AC

## 2021-05-14 MED ORDER — BUPIVACAINE-EPINEPHRINE 0.25% -1:200000 IJ SOLN
INTRAMUSCULAR | Status: DC | PRN
  Administered 2021-05-14: 30 mL

## 2021-05-14 MED ORDER — PROPOFOL 10 MG/ML IV BOLUS
INTRAVENOUS | Status: DC | PRN
  Administered 2021-05-14: 20 mg via INTRAVENOUS

## 2021-05-14 MED ORDER — METOCLOPRAMIDE HCL 5 MG PO TABS: 5.0000 mg | ORAL_TABLET | Freq: Three times a day (TID) | ORAL | Status: DC | PRN

## 2021-05-14 MED ORDER — PHENYLEPHRINE HCL-NACL 20-0.9 MG/250ML-% IV SOLN
INTRAVENOUS | Status: DC | PRN
  Administered 2021-05-14: 30 ug/min via INTRAVENOUS

## 2021-05-14 MED ORDER — LACTATED RINGERS IV SOLN
INTRAVENOUS | Status: DC
Start: 2021-05-14 — End: 2021-05-14

## 2021-05-14 MED ORDER — RIZATRIPTAN BENZOATE 10 MG PO TBDP: 10.0000 mg | ORAL_TABLET | ORAL | Status: DC | PRN

## 2021-05-14 MED ORDER — TRANEXAMIC ACID-NACL 1000-0.7 MG/100ML-% IV SOLN
1000.0000 mg | INTRAVENOUS | Status: AC
  Administered 2021-05-14: 1000 mg via INTRAVENOUS
  Filled 2021-05-14: qty 100

## 2021-05-14 MED ORDER — CEFAZOLIN SODIUM-DEXTROSE 2-4 GM/100ML-% IV SOLN
2.0000 g | INTRAVENOUS | Status: AC
  Administered 2021-05-14: 2 g via INTRAVENOUS
  Filled 2021-05-14: qty 100

## 2021-05-14 MED ORDER — ONDANSETRON HCL 4 MG/2ML IJ SOLN
INTRAMUSCULAR | Status: DC | PRN
  Administered 2021-05-14: 4 mg via INTRAVENOUS

## 2021-05-14 MED ORDER — MIDAZOLAM HCL 2 MG/2ML IJ SOLN
INTRAMUSCULAR | Status: AC
  Filled 2021-05-14: qty 2

## 2021-05-14 MED ORDER — ISOPROPYL ALCOHOL 70 % SOLN
Status: DC | PRN
  Administered 2021-05-14: 1 via TOPICAL

## 2021-05-14 MED ORDER — METHOCARBAMOL 500 MG PO TABS
500.0000 mg | ORAL_TABLET | Freq: Four times a day (QID) | ORAL | Status: DC | PRN
  Administered 2021-05-14: 500 mg via ORAL
  Filled 2021-05-14: qty 1

## 2021-05-14 MED ORDER — BUPIVACAINE-EPINEPHRINE (PF) 0.25% -1:200000 IJ SOLN
INTRAMUSCULAR | Status: AC
  Filled 2021-05-14: qty 30

## 2021-05-14 MED ORDER — HYDROXYZINE HCL 25 MG PO TABS: 12.5000 mg | ORAL_TABLET | Freq: Every evening | ORAL | Status: DC | PRN

## 2021-05-14 MED ORDER — ONDANSETRON HCL 4 MG/2ML IJ SOLN: 4.0000 mg | Freq: Four times a day (QID) | INTRAMUSCULAR | Status: DC | PRN

## 2021-05-14 MED ORDER — ACETAMINOPHEN 500 MG PO TABS
1000.0000 mg | ORAL_TABLET | Freq: Once | ORAL | Status: AC
  Administered 2021-05-14: 1000 mg via ORAL
  Filled 2021-05-14: qty 2

## 2021-05-14 MED ORDER — DEXAMETHASONE SODIUM PHOSPHATE 10 MG/ML IJ SOLN
INTRAMUSCULAR | Status: DC | PRN
Start: 2021-05-14 — End: 2021-05-14
  Administered 2021-05-14: 5 mg via INTRAVENOUS

## 2021-05-14 MED ORDER — ASPIRIN 81 MG PO CHEW
81.0000 mg | CHEWABLE_TABLET | Freq: Two times a day (BID) | ORAL | Status: DC
  Administered 2021-05-14 – 2021-05-15 (×2): 81 mg via ORAL
  Filled 2021-05-14 (×2): qty 1

## 2021-05-14 MED ORDER — LINACLOTIDE 145 MCG PO CAPS
145.0000 ug | ORAL_CAPSULE | ORAL | Status: DC | PRN
  Filled 2021-05-14: qty 1

## 2021-05-14 MED ORDER — KETOROLAC TROMETHAMINE 30 MG/ML IJ SOLN
INTRAMUSCULAR | Status: AC
  Filled 2021-05-14: qty 1

## 2021-05-14 MED ORDER — VITAMIN D (ERGOCALCIFEROL) 1.25 MG (50000 UNIT) PO CAPS: 50000.0000 [IU] | ORAL_CAPSULE | ORAL | Status: DC

## 2021-05-14 SURGICAL SUPPLY — 71 items
ATTUNE PSRP INSR SZ4 8 KNEE (Insert) ×1 IMPLANT
BAG COUNTER SPONGE SURGICOUNT (BAG) IMPLANT
BAG DECANTER FOR FLEXI CONT (MISCELLANEOUS) ×4 IMPLANT
BAG ZIPLOCK 12X15 (MISCELLANEOUS) ×2 IMPLANT
BLADE SAW RECIPROCATING 77.5 (BLADE) ×2 IMPLANT
BLADE SAW SGTL 81X20 HD (BLADE) ×2 IMPLANT
BLADE SURG SZ10 CARB STEEL (BLADE) ×2 IMPLANT
BNDG ELASTIC 6X5.8 VLCR STR LF (GAUZE/BANDAGES/DRESSINGS) ×2 IMPLANT
CEMENT BONE REFOBACIN R1X40 US (Cement) ×1 IMPLANT
CHLORAPREP W/TINT 26 (MISCELLANEOUS) ×4 IMPLANT
COMP PATELLA 32 STD 8.5 THK (Orthopedic Implant) IMPLANT
COVER SURGICAL LIGHT HANDLE (MISCELLANEOUS) ×2 IMPLANT
CUFF TOURN SGL QUICK 34 (TOURNIQUET CUFF) ×2
CUFF TRNQT CYL 34X4.125X (TOURNIQUET CUFF) ×1 IMPLANT
DERMABOND ADVANCED (GAUZE/BANDAGES/DRESSINGS) ×2
DERMABOND ADVANCED .7 DNX12 (GAUZE/BANDAGES/DRESSINGS) ×2 IMPLANT
DRAPE INCISE IOBAN 66X45 STRL (DRAPES) ×3 IMPLANT
DRAPE SHEET LG 3/4 BI-LAMINATE (DRAPES) ×6 IMPLANT
DRAPE U-SHAPE 47X51 STRL (DRAPES) ×2 IMPLANT
DRSG AQUACEL AG ADV 3.5X10 (GAUZE/BANDAGES/DRESSINGS) ×2 IMPLANT
DRSG TEGADERM 4X4.75 (GAUZE/BANDAGES/DRESSINGS) IMPLANT
ELECT BLADE TIP CTD 4 INCH (ELECTRODE) ×2 IMPLANT
ELECT REM PT RETURN 15FT ADLT (MISCELLANEOUS) ×2 IMPLANT
EVACUATOR 1/8 PVC DRAIN (DRAIN) ×1 IMPLANT
GLOVE BIO SURGEON STRL SZ8.5 (GLOVE) ×4 IMPLANT
GLOVE BIOGEL M 7.0 STRL (GLOVE) ×2 IMPLANT
GLOVE BIOGEL PI IND STRL 7.5 (GLOVE) ×1 IMPLANT
GLOVE BIOGEL PI IND STRL 8 (GLOVE) ×1 IMPLANT
GLOVE BIOGEL PI IND STRL 8.5 (GLOVE) ×1 IMPLANT
GLOVE BIOGEL PI INDICATOR 7.5 (GLOVE) ×1
GLOVE BIOGEL PI INDICATOR 8 (GLOVE) ×1
GLOVE BIOGEL PI INDICATOR 8.5 (GLOVE) ×1
GLOVE SURG LX 7.5 STRW (GLOVE) ×2
GLOVE SURG LX STRL 7.5 STRW (GLOVE) ×2 IMPLANT
GOWN SPEC L3 XXLG W/TWL (GOWN DISPOSABLE) ×4 IMPLANT
HANDPIECE INTERPULSE COAX TIP (DISPOSABLE) ×2
HOLDER FOLEY CATH W/STRAP (MISCELLANEOUS) IMPLANT
HOOD PEEL AWAY FLYTE STAYCOOL (MISCELLANEOUS) ×7 IMPLANT
JET LAVAGE IRRISEPT WOUND (IRRIGATION / IRRIGATOR)
KIT TURNOVER KIT A (KITS) IMPLANT
LAVAGE JET IRRISEPT WOUND (IRRIGATION / IRRIGATOR) IMPLANT
MANIFOLD NEPTUNE II (INSTRUMENTS) ×2 IMPLANT
MARKER SKIN DUAL TIP RULER LAB (MISCELLANEOUS) ×2 IMPLANT
NDL SPNL 18GX3.5 QUINCKE PK (NEEDLE) ×1 IMPLANT
NEEDLE SPNL 18GX3.5 QUINCKE PK (NEEDLE) ×2 IMPLANT
NS IRRIG 1000ML POUR BTL (IV SOLUTION) ×2 IMPLANT
PACK TOTAL KNEE CUSTOM (KITS) ×2 IMPLANT
PADDING CAST COTTON 6X4 STRL (CAST SUPPLIES) IMPLANT
PATELLA ZIMMER 32MM (Orthopedic Implant) ×2 IMPLANT
PROTECTOR NERVE ULNAR (MISCELLANEOUS) ×2 IMPLANT
SAW OSC TIP CART 19.5X105X1.3 (SAW) ×2 IMPLANT
SEALER BIPOLAR AQUA 6.0 (INSTRUMENTS) ×2 IMPLANT
SET HNDPC FAN SPRY TIP SCT (DISPOSABLE) ×1 IMPLANT
SET PAD KNEE POSITIONER (MISCELLANEOUS) ×2 IMPLANT
SPIKE FLUID TRANSFER (MISCELLANEOUS) IMPLANT
SPONGE DRAIN TRACH 4X4 STRL 2S (GAUZE/BANDAGES/DRESSINGS) ×1 IMPLANT
STAPLER VISISTAT 35W (STAPLE) ×1 IMPLANT
SUT MNCRL AB 3-0 PS2 18 (SUTURE) ×2 IMPLANT
SUT MON AB 2-0 CT1 36 (SUTURE) ×4 IMPLANT
SUT STRATAFIX PDO 1 14 VIOLET (SUTURE) ×2
SUT STRATFX PDO 1 14 VIOLET (SUTURE) ×1
SUT VIC AB 1 CTX 36 (SUTURE) ×4
SUT VIC AB 1 CTX36XBRD ANBCTR (SUTURE) ×2 IMPLANT
SUT VIC AB 2-0 CT1 27 (SUTURE) ×2
SUT VIC AB 2-0 CT1 TAPERPNT 27 (SUTURE) ×1 IMPLANT
SUTURE STRATFX PDO 1 14 VIOLET (SUTURE) ×1 IMPLANT
TOWER CARTRIDGE SMART MIX (DISPOSABLE) ×3 IMPLANT
TRAY FOLEY MTR SLVR 16FR STAT (SET/KITS/TRAYS/PACK) ×2 IMPLANT
TUBE SUCTION HIGH CAP CLEAR NV (SUCTIONS) ×2 IMPLANT
WATER STERILE IRR 1000ML POUR (IV SOLUTION) ×2 IMPLANT
WRAP KNEE MAXI GEL POST OP (GAUZE/BANDAGES/DRESSINGS) ×2 IMPLANT

## 2021-05-14 NOTE — Anesthesia Procedure Notes (Signed)
Procedure Name: [place] ?Date/Time: 05/14/2021 7:25 AM ?Performed by: Claudia Desanctis, CRNA ?Pre-anesthesia Checklist: Patient identified, Emergency Drugs available, Suction available and Patient being monitored ?Patient Re-evaluated:Patient Re-evaluated prior to induction ?Oxygen Delivery Method: Simple face mask ? ? ? ? ?

## 2021-05-14 NOTE — Anesthesia Procedure Notes (Signed)
Anesthesia Regional Block: Adductor canal block  ? ?Pre-Anesthetic Checklist: , timeout performed,  Correct Patient, Correct Site, Correct Laterality,  Correct Procedure,, site marked,  Risks and benefits discussed,  Surgical consent,  Pre-op evaluation,  At surgeon's request and post-op pain management ? ?Laterality: Left ? ?Prep: chloraprep     ?  ?Needles:  ?Injection technique: Single-shot ? ?Needle Type: Echogenic Stimulator Needle   ? ? ?Needle Length: 9cm  ?Needle Gauge: 21  ? ? ? ?Additional Needles: ? ? ?Procedures:,,,, ultrasound used (permanent image in chart),,    ?Narrative:  ?Start time: 05/14/2021 6:50 AM ?End time: 05/14/2021 7:00 AM ?Injection made incrementally with aspirations every 5 mL. ? ?Performed by: Personally  ?Anesthesiologist: Murvin Natal, MD ? ?Additional Notes: ?Functioning IV was confirmed and monitors were applied. A time-out was performed. Hand hygiene and sterile gloves were used. The thigh was placed in a frog-leg position and prepped in a sterile fashion. A 47mm 21ga Arrow echogenic stimulator needle was placed using ultrasound guidance.  Negative aspiration and negative test dose prior to incremental administration of local anesthetic. The patient tolerated the procedure well. ? ? ? ? ? ?

## 2021-05-14 NOTE — Transfer of Care (Signed)
Immediate Anesthesia Transfer of Care Note ? ?Patient: Mandy Grant ? ?Procedure(s) Performed: Revision patella component vs complete revision (Left: Knee) ? ?Patient Location: PACU ? ?Anesthesia Type:Spinal ? ?Level of Consciousness: awake, alert , oriented and patient cooperative ? ?Airway & Oxygen Therapy: Patient Spontanous Breathing and Patient connected to face mask ? ?Post-op Assessment: Report given to RN and Post -op Vital signs reviewed and stable ? ?Post vital signs: Reviewed and stable ? ?Last Vitals:  ?Vitals Value Taken Time  ?BP 126/69 05/14/21 1033  ?Temp    ?Pulse 96 05/14/21 1036  ?Resp 19 05/14/21 1036  ?SpO2 94 % 05/14/21 1036  ?Vitals shown include unvalidated device data. ? ?Last Pain:  ?Vitals:  ? 05/14/21 0547  ?TempSrc: Oral  ?PainSc: 2   ?   ? ?Patients Stated Pain Goal: 2 (05/14/21 0547) ? ?Complications: No notable events documented. ?

## 2021-05-14 NOTE — Anesthesia Preprocedure Evaluation (Addendum)
Anesthesia Evaluation  ?Patient identified by MRN, date of birth, ID band ?Patient awake ? ? ? ?Reviewed: ?Allergy & Precautions, NPO status , Patient's Chart, lab work & pertinent test results ? ?History of Anesthesia Complications ?(+) PONV and history of anesthetic complications ? ?Airway ?Mallampati: II ? ?TM Distance: >3 FB ?Neck ROM: Full ? ? ? Dental ?no notable dental hx. ? ?  ?Pulmonary ?former smoker,  ?  ?Pulmonary exam normal ? ? ? ? ? ? ? Cardiovascular ?negative cardio ROS ?Normal cardiovascular exam ? ?ECG: rate 79 ?  ?Neuro/Psych ? Headaches, PSYCHIATRIC DISORDERS Anxiety Depression  Neuromuscular disease   ? GI/Hepatic ?Neg liver ROS, GERD  Medicated and Controlled,IBS (irritable bowel syndrome) ?  ?Endo/Other  ?negative endocrine ROS ? Renal/GU ?negative Renal ROS  ? ?  ?Musculoskeletal ? ?(+) Arthritis , Fibromyalgia -Psoriatic arthritis  ? Abdominal ?(+) + obese,   ?Peds ? Hematology ?negative hematology ROS ?(+)   ?Anesthesia Other Findings ?Failed left total knee arthroplasty ? Reproductive/Obstetrics ? ?  ? ? ? ? ? ? ? ? ? ? ? ? ? ?  ?  ? ? ? ? ? ? ? ?Anesthesia Physical ?Anesthesia Plan ? ?ASA: 2 ? ?Anesthesia Plan: Regional and Spinal  ? ?Post-op Pain Management: Regional block*  ? ?Induction: Intravenous ? ?PONV Risk Score and Plan: 3 and Ondansetron, Dexamethasone, Propofol infusion, Midazolam and Treatment may vary due to age or medical condition ? ?Airway Management Planned: Simple Face Mask ? ?Additional Equipment:  ? ?Intra-op Plan:  ? ?Post-operative Plan:  ? ?Informed Consent: I have reviewed the patients History and Physical, chart, labs and discussed the procedure including the risks, benefits and alternatives for the proposed anesthesia with the patient or authorized representative who has indicated his/her understanding and acceptance.  ? ? ? ?Dental advisory given ? ?Plan Discussed with: CRNA ? ?Anesthesia Plan Comments:   ? ? ? ? ? ?Anesthesia  Quick Evaluation ? ?

## 2021-05-14 NOTE — H&P (Signed)
TOTAL KNEE REVISION ADMISSION H&P ?  ?Patient is being admitted for left revision total knee arthroplasty. ?  ?Subjective: ?  ?Chief Complaint:left knee pain. ?  ?HPI: Mandy Grant, 61 y.o. female, has a history of pain and functional disability in the left knee(s) due to failed previous arthroplasty. The indications for the revision of the total knee arthroplasty are loosening of one or more components. Onset of symptoms was abrupt starting  postoperatively  years ago with stable course since that time.  Prior procedures on the left knee(s) include arthroplasty.  Patient currently rates pain in the left knee(s) at 10 out of 10 with activity. There is night pain, worsening of pain with activity and weight bearing, pain that interferes with activities of daily living, pain with passive range of motion, crepitus, and joint swelling.  Patient has evidence of prosthetic loosening by imaging studies. This condition presents safety issues increasing the risk of falls.  There is no current active infection. ?  ?    ?Patient Active Problem List  ?  Diagnosis Date Noted  ? Chronic migraine w/o aura w/o status migrainosus, not intractable 11/28/2019  ? Chronic migraine 08/22/2019  ? Anxiety 08/22/2019  ? Neck pain 08/22/2019  ? Persistent headaches 08/22/2019  ? HNP (herniated nucleus pulposus), cervical 08/07/2013  ? Herniation of cervical intervertebral disc with radiculopathy 08/07/2013  ?  ?    ?Past Medical History:  ?Diagnosis Date  ? Adjustment reaction with anxiety and depression    ? Arthritis    ? Chronic gastritis    ? Complication of anesthesia    ?  "hard time waking up"  ? COVID-19 virus infection    ? Diabetes ([place])    ? Fibromyalgia    ? Headache(784.0)    ?  constant with nausea  ? Hypercholesteremia    ? IBS (irritable bowel syndrome)    ? Migraine    ? OA (osteoarthritis)    ?  ?     ?Past Surgical History:  ?Procedure Laterality Date  ? ANKLE FRACTURE SURGERY Right 09  ?  plates screws  ? ANTERIOR  CERVICAL DECOMP/DISCECTOMY FUSION N/A 08/07/2013  ?  Procedure: CERVICAL SIX-SEVEN ANTERIOR CERVICAL DECOMPRESSION FUSION WITH PEEK CAGE,PLATING.;  Surgeon: Charlie Pitter, MD;  Location: [facility];  Service: Neurosurgery;  Laterality: N/A;  ? CERVICAL DISC SURGERY   02  ? COLONOSCOPY   11/22/2012  ?  Colonic polyp, status post ploypectomy. Small internal hemorrhoids. Otherwise normal colonoscopy to terminal ileum  ? ESOPHAGOGASTRODUODENOSCOPY   09/20/2012  ?  Mild gastritis. Minimal scalloping od duodenal musosa of questionable importance (biopsied)  ? KNEE SURGERY      ?  ?       ?Current Outpatient Medications  ?Medication Sig Dispense Refill Last Dose  ? Acetaminophen (TYLENOL PO) Take by mouth as needed.        ? albuterol (VENTOLIN HFA) 108 (90 Base) MCG/ACT inhaler Inhale into the lungs.        ? benzonatate (TESSALON) 100 MG capsule benzonatate 100 mg capsule        ? butalbital-acetaminophen-caffeine (FIORICET) 50-325-40 MG tablet butalbital-acetaminophen-caffeine 50 mg-325 mg-40 mg tablet ? TAKE 1 TABLET BY MOUTH EVERY 6 HOURS AS NEEDED        ? cefdinir (OMNICEF) 300 MG capsule          ? cephALEXin (KEFLEX) 500 MG capsule cephalexin 500 mg capsule        ? DULoxetine (CYMBALTA) 30 MG capsule  Take 1 tablet by mouth daily.        ? estradiol-norethindrone (COMBIPATCH) 0.05-0.25 MG/DAY as directed. 1 patch 2 times a week        ? ezetimibe (ZETIA) 10 MG tablet Take 10 mg by mouth daily.        ? fluticasone (FLONASE) 50 MCG/ACT nasal spray as needed.         ? gabapentin (NEURONTIN) 100 MG capsule TAKE 3 CAPSULES BY MOUTH AT BEDTIME. 270 capsule 1    ? GELSYN-3 16.8 MG/2ML SOSY          ? guaiFENesin-codeine 100-10 MG/5ML syrup Take by mouth.        ? HYDROcodone-acetaminophen (NORCO/VICODIN) 5-325 MG tablet Take by mouth.        ? hydroxychloroquine (PLAQUENIL) 200 MG tablet Take by mouth daily.        ? linaclotide (LINZESS) 145 MCG CAPS capsule Take 1 capsule by mouth as needed.         ? magnesium oxide  (MAG-OX) 400 MG tablet Take 400 mg by mouth daily.        ? methocarbamol (ROBAXIN) 500 MG tablet methocarbamol 500 mg tablet        ? ondansetron (ZOFRAN ODT) 4 MG disintegrating tablet Take 1 tablet (4 mg total) by mouth every 8 (eight) hours as needed. 20 tablet 6    ? oxyCODONE (OXY IR/ROXICODONE) 5 MG immediate release tablet oxycodone 5 mg tablet ? Take 1 tablet every 4 hours by oral route.        ? pantoprazole (PROTONIX) 40 MG tablet TAKE 1 TABLET BY MOUTH EVERY DAY 90 tablet 0    ? predniSONE (STERAPRED UNI-PAK 21 TAB) 5 MG (21) TBPK tablet Take by mouth as directed. 21 tablet 0    ? Riboflavin 100 MG CAPS Take by mouth.        ? rizatriptan (MAXALT-MLT) 10 MG disintegrating tablet Take 1 tablet (10 mg total) by mouth as needed. May repeat in 2 hours if needed 15 tablet 6    ? Ubrogepant (UBRELVY) 50 MG TABS Take 50 mg by mouth as needed. 12 tablet 6    ? venlafaxine XR (EFFEXOR-XR) 150 MG 24 hr capsule Take 1 capsule (150 mg total) by mouth daily with breakfast. 90 capsule 3    ? Vitamin D, Ergocalciferol, (DRISDOL) 1.25 MG (50000 UT) CAPS capsule 1 capsule once a week.        ?  ?No current facility-administered medications for this visit.  ?  ?    ?Allergies  ?Allergen Reactions  ? Levofloxacin Other (See Comments)  ? Sulfa Antibiotics Nausea And Vomiting and Rash  ?  ?Social History  ?  ?     ?Tobacco Use  ? Smoking status: Former  ?    Packs/day: 0.50  ?    Years: 4.00  ?    Pack years: 2.00  ?    Types: Cigarettes  ?    Quit date: 07/28/1986  ?    Years since quitting: 34.6  ? Smokeless tobacco: Never  ?Substance Use Topics  ? Alcohol use: No  ?  ?     ?Family History  ?Problem Relation Age of Onset  ? Hypertension Mother    ? Other Father    ?      unsure of medical history  ? Diabetes Sister    ? Prostate cancer Maternal Grandfather    ? Colon cancer Neg Hx    ?  Esophageal cancer Neg Hx    ? Rectal cancer Neg Hx    ? Stomach cancer Neg Hx    ?  ?  ? Review of Systems  ?Musculoskeletal:  Positive for  arthralgias and joint swelling.  ?All other systems reviewed and are negative.  ?  ?Objective: ?  ?Physical Exam ?Constitutional:   ?   Appearance: Normal appearance.  ?HENT:  ?   Head: Normocephalic and atraumatic.  ?   Nose: Nose normal.  ?   Mouth/Throat:  ?   Mouth: Mucous membranes are moist.  ?   Pharynx: Oropharynx is clear.  ?Eyes:  ?   Extraocular Movements: Extraocular movements intact.  ?   Conjunctiva/sclera: Conjunctivae normal.  ?   Pupils: Pupils are equal, round, and reactive to light.  ?Cardiovascular:  ?   Rate and Rhythm: Normal rate and regular rhythm.  ?   Pulses: Normal pulses.  ?Pulmonary:  ?   Effort: Pulmonary effort is normal. No respiratory distress.  ?Abdominal:  ?   General: Abdomen is flat. There is no distension.  ?   Palpations: Abdomen is soft.  ?Genitourinary: ?   Comments: deferred ?Musculoskeletal:  ?   Cervical back: Normal range of motion and neck supple.  ?   Left knee: Swelling, bony tenderness and crepitus present. Decreased range of motion. Abnormal patellar mobility.  ?     Legs: ?Skin: ?   General: Skin is warm and dry.  ?   Capillary Refill: Capillary refill takes less than 2 seconds.  ?Neurological:  ?   General: No focal deficit present.  ?   Mental Status: She is alert and oriented to person, place, and time.  ?Psychiatric:     ?   Mood and Affect: Mood normal.     ?   Behavior: Behavior normal.     ?   Thought Content: Thought content normal.     ?   Judgment: Judgment normal.  ?  ?  ?Vital signs in last 24 hours: ?@VSRANGES @ ?  ?Labs: ?  ?Estimated body mass index is 34.75 kg/m? as calculated from the following: ?  Height as of 11/28/19: 5\' 2"  (1.575 m). ?  Weight as of 11/28/19: 86.2 kg. ?  ?Imaging Review ?Plain radiographs demonstrate cemented arthroplasty of the left knee(s). The overall alignment is neutral.There is evidence of loosening of the patellar components. The bone quality appears to be adequate for age and reported activity level. There is dislodgement  of the patellar button into the suprapatellar pouch. ?  ?  ?  ?Assessment/Plan: ?  ?End stage arthritis, left knee(s) with failed previous arthroplasty.  ?  ?The patient history, physical examination, clin

## 2021-05-14 NOTE — Discharge Instructions (Signed)
 Dr. Tiffanee Mcnee Total Joint Specialist [facility] [address] [place], [place] [address] [phone]  TOTAL KNEE REPLACEMENT POSTOPERATIVE DIRECTIONS    Knee Rehabilitation, Guidelines Following Surgery  Results after knee surgery are often greatly improved when you follow the exercise, range of motion and muscle strengthening exercises prescribed by your doctor. Safety measures are also important to protect the knee from further injury. Any time any of these exercises cause you to have increased pain or swelling in your knee joint, decrease the amount until you are comfortable again and slowly increase them. If you have problems or questions, call your caregiver or physical therapist for advice.   WEIGHT BEARING Weight bearing as tolerated with assist device (walker, cane, etc) as directed, use it as long as suggested by your surgeon or therapist, typically at least 4-6 weeks.  HOME CARE INSTRUCTIONS  Remove items at home which could result in a fall. This includes throw rugs or furniture in walking pathways.  Continue medications as instructed at time of discharge. You may have some home medications which will be placed on hold until you complete the course of blood thinner medication.  You may start showering once you are discharged home but do not submerge the incision under water. Just pat the incision dry and apply a dry gauze dressing on daily. Walk with walker as instructed.  You may resume a sexual relationship in one month or when given the OK by your doctor.  Use walker as long as suggested by your caregivers. Avoid periods of inactivity such as sitting longer than an hour when not asleep. This helps prevent blood clots.  You may put full weight on your legs and walk as much as is comfortable.  You may return to work once you are cleared by your doctor.  Do not drive a car for 6 weeks or until released by you surgeon.  Do not drive while  taking narcotics.  Wear the elastic stockings for three weeks following surgery during the day but you may remove then at night. Make sure you keep all of your appointments after your operation with all of your doctors and caregivers. You should call the office at the above phone number and make an appointment for approximately two weeks after the date of your surgery. Do not remove your surgical dressing. The dressing is waterproof; you may take showers in 3 days, but do not take tub baths or submerge the dressing. Please pick up a stool softener and laxative for home use as long as you are requiring pain medications. ICE to the affected knee every three hours for 30 minutes at a time and then as needed for pain and swelling.  Continue to use ice on the knee for pain and swelling from surgery. You may notice swelling that will progress down to the foot and ankle.  This is normal after surgery.  Elevate the leg when you are not up walking on it.   It is important for you to complete the blood thinner medication as prescribed by your doctor. Continue to use the breathing machine which will help keep your temperature down.  It is common for your temperature to cycle up and down following surgery, especially at night when you are not up moving around and exerting yourself.  The breathing machine keeps your lungs expanded and your temperature down.  RANGE OF MOTION AND STRENGTHENING EXERCISES  Rehabilitation of the knee is important following a knee injury or an   operation. After just a few days of immobilization, the muscles of the thigh which control the knee become weakened and shrink (atrophy). Knee exercises are designed to build up the tone and strength of the thigh muscles and to improve knee motion. Often times heat used for twenty to thirty minutes before working out will loosen up your tissues and help with improving the range of motion but do not use heat for the first two weeks following surgery.  These exercises can be done on a training (exercise) mat, on the floor, on a table or on a bed. Use what ever works the best and is most comfortable for you Knee exercises include:  Leg Lifts - While your knee is still immobilized in a splint or cast, you can do straight leg raises. Lift the leg to 60 degrees, hold for 3 sec, and slowly lower the leg. Repeat 10-20 times 2-3 times daily. Perform this exercise against resistance later as your knee gets better.  Quad and Hamstring Sets - Tighten up the muscle on the front of the thigh (Quad) and hold for 5-10 sec. Repeat this 10-20 times hourly. Hamstring sets are done by pushing the foot backward against an object and holding for 5-10 sec. Repeat as with quad sets.  A rehabilitation program following serious knee injuries can speed recovery and prevent re-injury in the future due to weakened muscles. Contact your doctor or a physical therapist for more information on knee rehabilitation.   POST-OPERATIVE OPIOID TAPER INSTRUCTIONS: It is important to wean off of your opioid medication as soon as possible. If you do not need pain medication after your surgery it is ok to stop day one. Opioids include: Codeine, Hydrocodone(Norco, Vicodin), Oxycodone(Percocet, oxycontin) and hydromorphone amongst others.  Long term and even short term use of opiods can cause: Increased pain response Dependence Constipation Depression Respiratory depression And more.  Withdrawal symptoms can include Flu like symptoms Nausea, vomiting And more Techniques to manage these symptoms Hydrate well Eat regular healthy meals Stay active Use relaxation techniques(deep breathing, meditating, yoga) Do Not substitute Alcohol to help with tapering If you have been on opioids for less than two weeks and do not have pain than it is ok to stop all together.  Plan to wean off of opioids This plan should start within one week post op of your joint replacement. Maintain the same  interval or time between taking each dose and first decrease the dose.  Cut the total daily intake of opioids by one tablet each day Next start to increase the time between doses. The last dose that should be eliminated is the evening dose.    SKILLED REHAB INSTRUCTIONS: If the patient is transferred to a skilled rehab facility following release from the hospital, a list of the current medications will be sent to the facility for the patient to continue.  When discharged from the skilled rehab facility, please have the facility set up the patient's Home Health Physical Therapy prior to being released. Also, the skilled facility will be responsible for providing the patient with their medications at time of release from the facility to include their pain medication, the muscle relaxants, and their blood thinner medication. If the patient is still at the rehab facility at time of the two week follow up appointment, the skilled rehab facility will also need to assist the patient in arranging follow up appointment in our office and any transportation needs.  MAKE SURE YOU:  Understand these instructions.  Will watch   your condition.  Will get help right away if you are not doing well or get worse.    Pick up stool softner and laxative for home use following surgery while on pain medications. Do NOT remove your dressing. You may shower.  Do not take tub baths or submerge incision under water. May shower starting three days after surgery. Please use a clean towel to pat the incision dry following showers. Continue to use ice for pain and swelling after surgery. Do not use any lotions or creams on the incision until instructed by your surgeon.  

## 2021-05-14 NOTE — Anesthesia Procedure Notes (Signed)
Spinal ? ?Patient location during procedure: OR ?Start time: 05/14/2021 7:20 AM ?End time: 05/14/2021 7:25 AM ?Reason for block: surgical anesthesia ?Staffing ?Performed: anesthesiologist  ?Anesthesiologist: Leonides Grills, MD ?Preanesthetic Checklist ?Completed: patient identified, IV checked, risks and benefits discussed, surgical consent, monitors and equipment checked, pre-op evaluation and timeout performed ?Spinal Block ?Patient position: sitting ?Prep: DuraPrep ?Patient monitoring: cardiac monitor, continuous pulse ox and blood pressure ?Approach: midline ?Location: L4-5 ?Injection technique: single-shot ?Needle ?Needle type: Pencan  ?Needle gauge: 24 G ?Needle length: 9 cm ?Assessment ?Sensory level: T10 ?Events: CSF return ?Additional Notes ?Functioning IV was confirmed and monitors were applied. Sterile prep and drape, including hand hygiene and sterile gloves were used. The patient was positioned and the spine was prepped. The skin was anesthetized with lidocaine.  Free flow of clear CSF was obtained prior to injecting local anesthetic into the CSF.  The spinal needle aspirated freely following injection.  The needle was carefully withdrawn.  The patient tolerated the procedure well.  ? ? ? ?

## 2021-05-14 NOTE — Op Note (Signed)
OPERATIVE REPORT ? ? ?05/14/2021 ? ?9:51 AM ? ?PATIENT:  Mandy Grant  ? ?SURGEON:  Bertram Savin, MD ? ?ASSISTANT:  Nehemiah Massed, PA-C ?Larene Pickett, PA-C  ? ?PREOPERATIVE DIAGNOSIS:  Failed left total knee arthroplasty. ? ?POSTOPERATIVE DIAGNOSIS:  Same. ? ?PROCEDURE:  1.  Revision patellar component left knee. ?2.  Soft tissue rebalancing left knee with polyliner exchange. ? ?ANESTHESIA:   Regional. ?Spinal. ?MAC. ? ?ANTIBIOTICS: 2 g Ancef. ? ?ESTIMATED BLOOD LOSS: 100 mL. ? ?IMPLANTS:  1.  DePuy attune AOX rotating platform polyliner, size 4, 8 mm, PS. ?2.  Zimmer NexGen all poly three peg patellar button, size 32 mm. ? ?EXPLANTS: 1.  DePuy attune AOX rotating platform polyliner, size 4, 6 mm, PS. ?2.  Attune oval dome patella, size 29 mm. ? ?TUBES AND DRAINS: None. ? ?SPECIMENS: None. ? ?COMPLICATIONS: None. ? ?DISPOSITION: Stable to PACU. ? ?SURGICAL INDICATIONS:  Mandy Grant is a 61 y.o. female who underwent primary left total knee arthroplasty by Dr. Theda Sers on 07/08/2020.  She developed patellofemoral crepitation, anterior knee pain with weightbearing activities, and difficulty achieving full extension.  She also reported instability with bent knee activities.  Work-up was performed demonstrating failure of the patellar button.  Patient was then referred to me.  We discussed the risks, benefits, and alternatives to patellar component revision, possible polyliner exchange, possible revision total knee arthroplasty. ? ?The risks, benefits, and alternatives were discussed with the patient. There are risks associated with the surgery including, but not limited to, problems with anesthesia (death), infection, instability (giving out of the joint), dislocation, differences in leg length/angulation/rotation, fracture of bones, loosening or failure of implants, hematoma (blood accumulation) which may require surgical drainage, blood clots, pulmonary embolism, nerve injury (foot drop), and blood vessel  injury. The patient understands these risks and elects to proceed. ? ?PROCEDURE IN DETAIL: The patient was identified in the holding area using 2 identifiers.  The surgical site was marked by myself.  Anesthesia team placed an adductor canal block.  She was taken to the operating room, and spinal anesthesia was obtained.  A Foley catheter was placed.  She was then placed supine on the operating room table.  All bony prominences were well-padded.  A tourniquet was not utilized.  The left lower extremity was prepped and draped in the normal sterile surgical fashion.  Timeout was called, verifying site and site of surgery. ? ?The knee was flexed to approximately 60 degrees.  Using a #10 blade, I sharply excised her previous anterior knee scar.  Full-thickness skin flaps were created.  The knee was irrigated with Prontosan solution.  Medial parapatellar arthrotomy was created.  The patellar button was encountered within the suprapatellar pouch.  I removed the patellar button, and there was no cement adherent to the backside.  A medial release was performed.   ? ?The knee was then brought into extension.  Infrapatellar scar was sharply excised with Bovie electrocautery.  Radical synovectomy was performed of the medial gutter, lateral gutter, and suprapatellar pouch.  I removed scar from the backside of the patella.  The cement mantle on the patellar remnant was intact.  The patellar remnant was freshened with a saw.  Postresection thickness was roughly 15 mm.  I then sized the patella to a 32 mm Zimmer NexGen round patella, which provided excellent bony coverage.  The peg holes were drilled.  I placed a trial patellar button.  I then examined the balance of the knee.  There was some laxity  in full extension and at 90 degrees of flexion without any frank instability. ? ?The knee was then flexed to 90 degrees.  Using a straight osteotome, I amputated the peg of the RP polyliner.  The liner was removed with a Cobb.  The RP  peg was removed with a kocher.  The femoral and tibial components were inspected and found to be well fixed.  I then placed an 8 mm PS trial liner.  The knee was brought through a range of motion.  There was excellent stability in full extension and at 90 degrees of flexion.  The patella tracked centrally using no thumbs technique.  The trial implants were then removed.  The knee was then irrigated with normal saline using pulse lavage.  I placed the real 8 mm PS RP polyliner.  The knee was then brought into extension.  The cut patellar remnant was irrigated with normal saline using pulse lavage and dried with a lap sponge.  Cement was mixed on the back table.  The real patellar button was cemented into place.  Excess cement was cleared.  Patellar clamp was placed to provide compression while the cement cured.  Once the cement was fully polymerized, repeat stability testing was performed.  There was excellent range of motion.  The knee was stable to a varus and valgus force throughout her range of motion.  The flexion and extension gaps were well-balanced.  The patella tracked centrally using no thumbs technique. ? ?Meticulous hemostasis was achieved with the Aquamantys and Bovie electrocautery.  The knee was irrigated with normal saline using pulse lavage and Prontosan solution.  The arthrotomy was closed with #1 Vicryl and #1 strata fix.  Deep dermal tissue was reapproximated with 2-0 Monocryl suture.  Skin was reapproximated with staples.  Dermabond was applied to the skin.  Once the glue was fully hardened, an Aquacel Ag dressing was applied followed by a compressive wrap.  The patient was then aroused from anesthesia, and taken to the PACU in stable condition.  Sponge, needle, and instrument counts were correct at the end of the case x2.  There were no known complications. ? ?Please note that a surgical assistant was a medical necessity for this procedure in order to perform it in a safe and expeditious manner.  Surgical assistant was necessary to retract the ligaments and vital neurovascular structures to prevent injury to them and also necessary for proper positioning of the limb to allow for anatomic placement of the prosthesis. ?

## 2021-05-14 NOTE — Evaluation (Signed)
Physical Therapy Evaluation ?Patient Details ?Name: Mandy Grant ?MRN: [id number] ?DOB: 1960-03-09 ?Today's Date: 05/14/2021 ? ?History of Present Illness ? 61 yo S/P revision LTKA, patella component. PMH: fibromyalgia, anxiety/depression, pre-diabetes  ?Clinical Impression ? The patient is  relieved that she is doing well post op and  able to ambulate with less pain. Patient plans Dc home with OPPT. Pt admitted with above diagnosis.  Pt currently with functional limitations due to the deficits listed below (see PT Problem List). Pt will benefit from skilled PT to increase their independence and safety with mobility to allow discharge to the venue listed below.   ?  Pt. Ambulated x150' using RW. ?   ? ?Recommendations for follow up therapy are one component of a multi-disciplinary discharge planning process, led by the attending physician.  Recommendations may be updated based on patient status, additional functional criteria and insurance authorization. ? ?Follow Up Recommendations Follow physician's recommendations for discharge plan and follow up therapies ? ?  ?Assistance Recommended at Discharge Intermittent Supervision/Assistance  ?Patient can return home with the following ? A little help with walking and/or transfers;Help with stairs or ramp for entrance;Assistance with cooking/housework;Assist for transportation ? ?  ?Equipment Recommendations None recommended by PT  ?Recommendations for Other Services ?    ?  ?Functional Status Assessment Patient has had a recent decline in their functional status and demonstrates the ability to make significant improvements in function in a reasonable and predictable amount of time.  ? ?  ?Precautions / Restrictions Precautions ?Precautions: Knee  ? ?  ? ?Mobility ? Bed Mobility ?Overal bed mobility: Modified Independent ?  ?  ?  ?  ?  ?  ?  ?  ? ?Transfers ?Overall transfer level: Needs assistance ?Equipment used: Rolling walker (2 wheels) ?Transfers: Sit to/from  Stand ?Sit to Stand: Supervision ?  ?  ?  ?  ?  ?General transfer comment: cues for safety ?  ? ?Ambulation/Gait ?Ambulation/Gait assistance: Min guard ?Gait Distance (Feet): 150 Feet ?Assistive device: Rolling walker (2 wheels) ?Gait Pattern/deviations: Step-to pattern, Step-through pattern ?  ?  ?  ?General Gait Details: cues for sequence ? ?Stairs ?  ?  ?  ?  ?  ? ?Wheelchair Mobility ?  ? ?Modified Rankin (Stroke Patients Only) ?  ? ?  ? ?Balance Overall balance assessment: No apparent balance deficits (not formally assessed) ?  ?  ?  ?  ?  ?  ?  ?  ?  ?  ?  ?  ?  ?  ?  ?  ?  ?  ?   ? ? ? ?Pertinent Vitals/Pain Pain Assessment ?Pain Assessment: 0-10 ?Pain Score: 6  ?Pain Location: L knee ?Pain Descriptors / Indicators: Discomfort ?Pain Intervention(s): Premedicated before session, Monitored during session  ? ? ?Home Living Family/patient expects to be discharged to:: Private residence ?Living Arrangements: Spouse/significant other ?Available Help at Discharge: Family ?Type of Home: House ?Home Access: Stairs to enter ?Entrance Stairs-Rails: Right;Left ?Entrance Stairs-Number of Steps: 2 ?  ?Home Layout: One level ?Home Equipment: Agricultural consultant (2 wheels);Cane - single point ?   ?  ?Prior Function Prior Level of Function : Independent/Modified Independent ?  ?  ?  ?  ?  ?  ?  ?  ?  ? ? ?Hand Dominance  ?   ? ?  ?Extremity/Trunk Assessment  ? Upper Extremity Assessment ?Upper Extremity Assessment: Overall WFL for tasks assessed ?  ? ?Lower Extremity Assessment ?Lower Extremity Assessment: LLE deficits/detail ?  LLE Deficits / Details: + SLR, knee flexion 0-60 ?  ? ?Cervical / Trunk Assessment ?Cervical / Trunk Assessment: Normal  ?Communication  ? Communication: No difficulties  ?Cognition Arousal/Alertness: Awake/alert ?Behavior During Therapy: [facility] for tasks assessed/performed, Impulsive ?Overall Cognitive Status: Within Functional Limits for tasks assessed ?  ?  ?  ?  ?  ?  ?  ?  ?  ?  ?  ?  ?  ?  ?  ?  ?  ?  ?   ? ?  ?General Comments   ? ?  ?Exercises    ? ?Assessment/Plan  ?  ?PT Assessment Patient needs continued PT services  ?PT Problem List Decreased strength;Decreased mobility;Decreased range of motion;Decreased knowledge of precautions;Decreased activity tolerance;Pain ? ?   ?  ?PT Treatment Interventions DME instruction;Therapeutic activities;Gait training;Therapeutic exercise;Patient/family education;Stair training;Functional mobility training   ? ?PT Goals (Current goals can be found in the Care Plan section)  ?Acute Rehab PT Goals ?Patient Stated Goal: go home ?PT Goal Formulation: With patient/family ?Time For Goal Achievement: 05/21/21 ?Potential to Achieve Goals: Good ? ?  ?Frequency 7X/week ?  ? ? ?Co-evaluation   ?  ?  ?  ?  ? ? ?  ?AM-PAC PT "6 Clicks" Mobility  ?Outcome Measure Help needed turning from your back to your side while in a flat bed without using bedrails?: None ?Help needed moving from lying on your back to sitting on the side of a flat bed without using bedrails?: None ?Help needed moving to and from a bed to a chair (including a wheelchair)?: A Little ?Help needed standing up from a chair using your arms (e.g., wheelchair or bedside chair)?: A Little ?Help needed to walk in hospital room?: A Little ?Help needed climbing 3-5 steps with a railing? : A Little ?6 Click Score: 20 ? ?  ?End of Session Equipment Utilized During Treatment: Gait belt ?Activity Tolerance: Patient tolerated treatment well ?Patient left: in bed;with call bell/phone within reach;with family/visitor present ?Nurse Communication: Mobility status ?PT Visit Diagnosis: Unsteadiness on feet (R26.81);Difficulty in walking, not elsewhere classified (R26.2);Pain ?Pain - Right/Left: Left ?Pain - part of body: Knee ?  ? ?Time: 8563-1497 ?PT Time Calculation (min) (ACUTE ONLY): 35 min ? ? ?Charges:   PT Evaluation ?$PT Eval Low Complexity: 1 Low ?PT Treatments ?$Gait Training: 8-22 mins ?  ?   ? ? ?Blanchard Kelch PT ?Acute  Rehabilitation Services ?Pager [phone] ?Office [phone] ? ? ?Meygan Kyser, Jobe Igo ?05/14/2021, 4:30 PM ? ?

## 2021-05-14 NOTE — Plan of Care (Signed)
  Problem: Nutrition: Goal: Adequate nutrition will be maintained Outcome: Progressing   Problem: Pain Managment: Goal: General experience of comfort will improve Outcome: Progressing   

## 2021-05-15 ENCOUNTER — Encounter (HOSPITAL_COMMUNITY): Payer: Self-pay | Admitting: Orthopedic Surgery

## 2021-05-15 LAB — BASIC METABOLIC PANEL
Anion gap: 4 — ABNORMAL LOW (ref 5–15)
BUN: 11 mg/dL (ref 6–20)
CO2: 25 mmol/L (ref 22–32)
Calcium: 7.9 mg/dL — ABNORMAL LOW (ref 8.9–10.3)
Chloride: 112 mmol/L — ABNORMAL HIGH (ref 98–111)
Creatinine, Ser: 0.67 mg/dL (ref 0.44–1.00)
GFR, Estimated: >60 mL/min (ref >60)
Glucose, Bld: 143 mg/dL — ABNORMAL HIGH (ref 70–99)
Potassium: 3.8 mmol/L (ref 3.5–5.1)
Sodium: 141 mmol/L (ref 135–145)

## 2021-05-15 LAB — CBC
HCT: 33.6 % — ABNORMAL LOW (ref 36.0–46.0)
Hemoglobin: 11 g/dL — ABNORMAL LOW (ref 12.0–15.0)
MCH: 29.6 pg (ref 26.0–34.0)
MCHC: 32.7 g/dL (ref 30.0–36.0)
MCV: 90.6 fL (ref 80.0–100.0)
Platelets: 241 10*3/uL (ref 150–400)
RBC: 3.71 MIL/uL — ABNORMAL LOW (ref 3.87–5.11)
RDW: 14.3 % (ref 11.5–15.5)
WBC: 16.7 10*3/uL — ABNORMAL HIGH (ref 4.0–10.5)
nRBC: 0 % (ref 0.0–0.2)

## 2021-05-15 MED ORDER — METHOCARBAMOL 500 MG PO TABS: 500.0000 mg | ORAL_TABLET | Freq: Four times a day (QID) | ORAL | 0 refills | Status: AC | PRN

## 2021-05-15 MED ORDER — SENNA 8.6 MG PO TABS: 1.0000 | ORAL_TABLET | Freq: Two times a day (BID) | ORAL | 0 refills | Status: DC

## 2021-05-15 MED ORDER — DOCUSATE SODIUM 100 MG PO CAPS: 100.0000 mg | ORAL_CAPSULE | Freq: Two times a day (BID) | ORAL | 0 refills | Status: AC

## 2021-05-15 MED ORDER — ASPIRIN 81 MG PO CHEW: 81.0000 mg | CHEWABLE_TABLET | Freq: Two times a day (BID) | ORAL | 0 refills | Status: AC

## 2021-05-15 MED ORDER — HYDROCODONE-ACETAMINOPHEN 10-325 MG PO TABS: 0.5000 | ORAL_TABLET | ORAL | 0 refills | Status: AC | PRN

## 2021-05-15 MED ORDER — POLYETHYLENE GLYCOL 3350 17 G PO PACK: 17.0000 g | PACK | Freq: Every day | ORAL | 0 refills | Status: AC | PRN

## 2021-05-15 NOTE — Anesthesia Postprocedure Evaluation (Signed)
Anesthesia Post Note ? ?Patient: Mandy Grant ? ?Procedure(s) Performed: Revision patella component vs complete revision (Left: Knee) ? ?  ? ?Patient location during evaluation: PACU ?Anesthesia Type: Regional and Spinal ?Level of consciousness: awake ?Pain management: pain level controlled ?Vital Signs Assessment: post-procedure vital signs reviewed and stable ?Respiratory status: spontaneous breathing, respiratory function stable and patient connected to nasal cannula oxygen ?Cardiovascular status: blood pressure returned to baseline and stable ?Postop Assessment: no headache, no backache and no apparent nausea or vomiting ?Anesthetic complications: no ? ? ?No notable events documented. ? ?Last Vitals:  ?Vitals:  ? 05/15/21 0154 05/15/21 0514  ?BP: 131/67 139/75  ?Pulse: 78 77  ?Resp: 17 17  ?Temp: 36.7 ?C 36.7 ?C  ?SpO2: 97% 99%  ?  ?Last Pain:  ?Vitals:  ? 05/15/21 0514  ?TempSrc: Oral  ?PainSc:   ? ? ?  ?  ?  ?  ?  ?  ? ?Tameya Kuznia P Luan Maberry ? ? ? ? ?

## 2021-05-15 NOTE — Progress Notes (Signed)
? ? ?  Subjective: ? ?Patient reports pain as mild to moderate.  Denies N/V/CP/SOB/Abd pain. No c/o. Patient had lots of questions about her procedure. All questions solicited and answered. Patient was asking about pain medications to go home with. She is requesting hydrocodone.  ? ?Objective:  ? ?VITALS:   ?Vitals:  ? 05/14/21 1749 05/14/21 2109 05/15/21 0154 05/15/21 0514  ?BP: 136/77 135/70 131/67 139/75  ?Pulse: 77 81 78 77  ?Resp: 15 17 17 17   ?Temp: 97.7 ?F (36.5 ?C) 98.2 ?F (36.8 ?C) 98 ?F (36.7 ?C) 98 ?F (36.7 ?C)  ?TempSrc: Oral Oral Oral Oral  ?SpO2: 98% 97% 97% 99%  ?Weight:      ?Height:      ? ? ?NAD ?Neurologically intact ?ABD soft ?Neurovascular intact ?Sensation intact distally ?Intact pulses distally ?Dorsiflexion/Plantar flexion intact ?Incision: dressing C/D/I ?No cellulitis present ?Compartment soft ? ? ?Lab Results  ?Component Value Date  ? WBC 16.7 (H) 05/15/2021  ? HGB 11.0 (L) 05/15/2021  ? HCT 33.6 (L) 05/15/2021  ? MCV 90.6 05/15/2021  ? PLT 241 05/15/2021  ? ?BMET ?   ?Component Value Date/Time  ? NA 141 05/15/2021 0300  ? K 3.8 05/15/2021 0300  ? CL 112 (H) 05/15/2021 0300  ? CO2 25 05/15/2021 0300  ? GLUCOSE 143 (H) 05/15/2021 0300  ? BUN 11 05/15/2021 0300  ? CREATININE 0.67 05/15/2021 0300  ? CALCIUM 7.9 (L) 05/15/2021 0300  ? GFRNONAA >60 05/15/2021 0300  ? ? ? ?Assessment/Plan: ?1 Day Post-Op  ? ?Principal Problem: ?  Failed total knee, left ([place]) ? ? ?WBAT with walker ?DVT ppx: Aspirin, SCDs, TEDS ?PO pain control: Currently taking hydromorphone. Patient is requesting hydrocodone at discharge. She states that she cannot take percocet, that it feels like bugs are crawling on her. ?PT/OT: Patient ambulated with PT yesterday about 150 ft w/ rolling walker.  ?Dispo: Planned d/c home today. Planned OPPT at discharge.  ? ? ? ?Charlott Rakes ?05/15/2021, 7:03 AM ? ? ?Rod Can, MD ?[phone] ?[place] [facility] [employer]  [facility] ?[address],  [place], [place] [address] ?Phone: [phone] ?[email or web address] ?Facebook  Engineer, structural  ?  ? ? ?

## 2021-05-15 NOTE — TOC Transition Note (Signed)
Transition of Care (TOC) - CM/SW Discharge Note ? ? ?Patient Details  ?Name: Mandy Grant ?MRN: [id number] ?Date of Birth: [birth year] ? ?Transition of Care (TOC) CM/SW Contact:  ?Yanai Hobson, LCSW ?Phone Number: ?05/15/2021, 10:05 AM ? ? ?Clinical Narrative:    ?Met with pt and confirming plan for OPPT at [facility] and has all needed DME at home.  No TOC needs. ? ? ?Final next level of care: OP Rehab ?Barriers to Discharge: No Barriers Identified ? ? ?Patient Goals and CMS Choice ?Patient states their goals for this hospitalization and ongoing recovery are:: return home ?  ?  ? ?Discharge Placement ?  ?           ?  ?  ?  ?  ? ?Discharge Plan and Services ?  ?  ?           ?DME Arranged: N/A ?DME Agency: NA ?  ?  ?  ?  ?  ?  ?  ?  ? ?Social Determinants of Health (SDOH) Interventions ?  ? ? ?Readmission Risk Interventions ?   ? View : No data to display.  ?  ?  ?  ? ? ? ? ? ?

## 2021-05-15 NOTE — Progress Notes (Signed)
Physical Therapy Treatment ?Patient Details ?Name: Mandy Grant ?MRN: [id number] ?DOB: [birth year] ?Today's Date: 05/15/2021 ? ? ?History of Present Illness 61 yo S/P revision LTKA, patella component. PMH: fibromyalgia, anxiety/depression, pre-diabetes ? ?  ?PT Comments  ? ? Patient reports pain meds not helping. Patient did tolerate ambulation and practiced steps. Patient has met PT goals for DC. Patient feeling very fatigued, lack of sleep. Looking forward to return home .   ?Recommendations for follow up therapy are one component of a multi-disciplinary discharge planning process, led by the attending physician.  Recommendations may be updated based on patient status, additional functional criteria and insurance authorization. ? ?Follow Up Recommendations ? Follow physician's recommendations for discharge plan and follow up therapies ?  ?  ?Assistance Recommended at Discharge Intermittent Supervision/Assistance  ?Patient can return home with the following A little help with walking and/or transfers;Help with stairs or ramp for entrance;Assistance with cooking/housework;Assist for transportation ?  ?Equipment Recommendations ? None recommended by PT  ?  ?Recommendations for Other Services   ? ? ?  ?Precautions / Restrictions Precautions ?Precautions: Fall;Knee ?Restrictions ?Weight Bearing Restrictions: No  ?  ? ?Mobility ? Bed Mobility ?Overal bed mobility: Modified Independent ?  ?  ?  ?  ?  ?  ?  ?  ? ?Transfers ?Overall transfer level: Needs assistance ?Equipment used: Rolling walker (2 wheels) ?Transfers: Sit to/from Stand ?Sit to Stand: Supervision ?  ?  ?  ?  ?  ?General transfer comment: cues for safety ?  ? ?Ambulation/Gait ?Ambulation/Gait assistance: Supervision ?Gait Distance (Feet): 100 Feet ?Assistive device: Rolling walker (2 wheels) ?Gait Pattern/deviations: Step-to pattern, Step-through pattern ?  ?  ?  ?General Gait Details: cues for sequence, best to roll vs pick up Rw. ? ? ?Stairs ?Stairs:  Yes ?Stairs assistance: Min assist ?Stair Management: No rails, Backwards, With walker ?Number of Stairs: 2 ?General stair comments: patient reports that she goes sideways with RW, did demo backwards for safety. ? ? ?Wheelchair Mobility ?  ? ?Modified Rankin (Stroke Patients Only) ?  ? ? ?  ?Balance Overall balance assessment: No apparent balance deficits (not formally assessed) ?  ?  ?  ?  ?  ?  ?  ?  ?  ?  ?  ?  ?  ?  ?  ?  ?  ?  ?  ? ?  ?Cognition Arousal/Alertness: Awake/alert ?Behavior During Therapy: [facility] for tasks assessed/performed, Impulsive ?Overall Cognitive Status: Within Functional Limits for tasks assessed ?  ?  ?  ?  ?  ?  ?  ?  ?  ?  ?  ?  ?  ?  ?  ?  ?  ?  ?  ? ?  ?Exercises   ? ?  ?General Comments   ?  ?  ? ?Pertinent Vitals/Pain Pain Assessment ?Pain Score: 8  ?Pain Location: L knee ?Pain Descriptors / Indicators: Discomfort, Aching ?Pain Intervention(s): Monitored during session, Premedicated before session, Ice applied  ? ? ?Home Living   ?  ?  ?  ?  ?  ?  ?  ?  ?  ?   ?  ?Prior Function    ?  ?  ?   ? ?PT Goals (current goals can now be found in the care plan section) Progress towards PT goals: Progressing toward goals ? ?  ?Frequency ? ? ? 7X/week ? ? ? ?  ?PT Plan Current plan remains appropriate  ? ? ?Co-evaluation   ?  ?  ?  ?  ? ?  ?  AM-PAC PT "6 Clicks" Mobility   ?Outcome Measure ? Help needed turning from your back to your side while in a flat bed without using bedrails?: None ?Help needed moving from lying on your back to sitting on the side of a flat bed without using bedrails?: None ?Help needed moving to and from a bed to a chair (including a wheelchair)?: None ?Help needed standing up from a chair using your arms (e.g., wheelchair or bedside chair)?: None ?Help needed to walk in hospital room?: A Little ?Help needed climbing 3-5 steps with a railing? : A Little ?6 Click Score: 22 ? ?  ?End of Session Equipment Utilized During Treatment: Gait belt ?Activity Tolerance: Patient  tolerated treatment well ?Patient left: in chair;with call bell/phone within reach ?Nurse Communication: Mobility status ?PT Visit Diagnosis: Unsteadiness on feet (R26.81);Difficulty in walking, not elsewhere classified (R26.2);Pain ?Pain - Right/Left: Left ?Pain - part of body: Knee ?  ? ? ?Time: [phone] ?PT Time Calculation (min) (ACUTE ONLY): 25 min ? ?Charges:  $Gait Training: 23-37 mins          ?          ?Mandy Grant PT ?Acute Rehabilitation Services ?Pager [phone] ?Office [phone] ? ? ? ?Mandy Grant, Shella Maxim ?05/15/2021, 9:23 AM ? ?

## 2021-05-15 NOTE — Plan of Care (Signed)
?  Problem: Activity: ?Goal: Risk for activity intolerance will decrease ?Outcome: Progressing ?  ?Problem: Pain Managment: ?Goal: General experience of comfort will improve ?Outcome: Progressing ?  ?Problem: Education: ?Goal: Individualized Educational Video(s) ?Outcome: Progressing ?  ?

## 2021-05-19 NOTE — Discharge Summary (Signed)
Physician Discharge Summary  ?Patient ID: ?Mandy Grant ?MRN: [id number] ?DOB/AGE: 60/01/1960 60 y.o. ? ?Admit date: 05/14/2021 ?Discharge date: 05/19/2021 ? ?Admission Diagnoses:  ?Failed total knee, left (HCC) ? ?Discharge Diagnoses:  ?Principal Problem: ?  Failed total knee, left (HCC) ? ? ?Past Medical History:  ?Diagnosis Date  ? Adjustment reaction with anxiety and depression   ? Anxiety   ? Arthritis   ? Chronic gastritis   ? Complication of anesthesia   ? "hard time waking up"  ? COVID-19 virus infection   ? Depression   ? Fibromyalgia   ? GERD (gastroesophageal reflux disease)   ? Headache(784.0)   ? constant with nausea  ? Hypercholesteremia   ? IBS (irritable bowel syndrome)   ? Migraine   ? OA (osteoarthritis)   ? PONV (postoperative nausea and vomiting)   ? Pre-diabetes   ? Psoriatic arthritis (HCC)   ? ? ?Surgeries: Procedure(s): ?1.  Revision patellar component left knee. ?2.  Soft tissue rebalancing left knee with polyliner exchange on 05/14/21 ?  ?Consultants (if any):  ? ?Discharged Condition: Improved ? ?Hospital Course: Mandy Grant is an 61 y.o. female who was admitted 05/14/2021 with a diagnosis of Failed total knee, left (HCC) and went to the operating room on 05/14/2021 and underwent the above named procedures.   ? ?She was given perioperative antibiotics:  ?Anti-infectives (From admission, onward)  ? ? Start     Dose/Rate Route Frequency Ordered Stop  ? 05/14/21 1400  ceFAZolin (ANCEF) IVPB 2g/100 mL premix       ? 2 g ?200 mL/hr over 30 Minutes Intravenous Every 6 hours 05/14/21 1130 05/14/21 2021  ? 05/14/21 0600  ceFAZolin (ANCEF) IVPB 2g/100 mL premix       ? 2 g ?200 mL/hr over 30 Minutes Intravenous On call to O.R. 05/14/21 [id number] 05/14/21 0741  ? ?  ? ? ?She was given sequential compression devices, early ambulation, and Aspirin for DVT prophylaxis. ? ?She ambulated with PT 150 feet with rolling walker on POD#0. POD#1 she ambulated with therapy and was cleared to go home.  ? ?She benefited  maximally from the hospital stay and there were no complications.   ? ?Recent vital signs:  ?Vitals:  ? 05/15/21 0514 05/15/21 0951  ?BP: 139/75 138/70  ?Pulse: 77 84  ?Resp: 17 16  ?Temp: 98 ?F (36.7 ?C) 98.2 ?F (36.8 ?C)  ?SpO2: 99% 98%  ? ? ?Recent laboratory studies:  ?Lab Results  ?Component Value Date  ? HGB 11.0 (L) 05/15/2021  ? HGB 14.1 05/07/2021  ? HGB 13.8 03/19/2021  ? ?Lab Results  ?Component Value Date  ? WBC 16.7 (H) 05/15/2021  ? PLT 241 05/15/2021  ? ?No results found for: INR ?Lab Results  ?Component Value Date  ? NA 141 05/15/2021  ? K 3.8 05/15/2021  ? CL 112 (H) 05/15/2021  ? CO2 25 05/15/2021  ? BUN 11 05/15/2021  ? CREATININE 0.67 05/15/2021  ? GLUCOSE 143 (H) 05/15/2021  ? ? ? ?Allergies as of 05/15/2021   ? ?   Reactions  ? Levofloxacin Other (See Comments)  ? Felt rotten, "felt like I was run over"  ? Percocet [oxycodone-acetaminophen] Palpitations  ? Skin crawling, tolerates hydrocodone   ? Sulfa Antibiotics Nausea And Vomiting, Rash  ? ?  ? ?  ?Medication List  ?  ? ?TAKE these medications   ? ?ARTIFICIAL TEAR SOLUTION OP ?Place 1 drop into both eyes daily as needed (dry eyes). ?  ?aspirin  81 MG chewable tablet ?Commonly known as: Aspirin Childrens ?Chew 1 tablet (81 mg total) by mouth 2 (two) times daily with a meal. ?  ?diphenhydrAMINE 25 MG tablet ?Commonly known as: BENADRYL ?Take 12.5 mg by mouth at bedtime as needed for allergies. ?  ?docusate sodium 100 MG capsule ?Commonly known as: Colace ?Take 1 capsule (100 mg total) by mouth 2 (two) times daily. ?  ?ezetimibe 10 MG tablet ?Commonly known as: ZETIA ?Take 10 mg by mouth daily. ?  ?fluticasone 50 MCG/ACT nasal spray ?Commonly known as: FLONASE ?Place 1 spray into both nostrils daily as needed for allergies. ?  ?gabapentin 100 MG capsule ?Commonly known as: NEURONTIN ?TAKE 3 CAPSULES BY MOUTH AT BEDTIME. ?What changed:  ?how much to take ?when to take this ?  ?HYDROcodone-acetaminophen 10-325 MG tablet ?Commonly known as:  Norco ?Take 0.5 tablets by mouth every 4 (four) hours as needed for up to 7 days for moderate pain or severe pain. ?  ?hydroxychloroquine 200 MG tablet ?Commonly known as: PLAQUENIL ?Take 200 mg by mouth 2 (two) times daily. ?  ?hydrOXYzine 25 MG tablet ?Commonly known as: ATARAX ?Take 12.5 mg by mouth at bedtime as needed for itching. ?  ?linaclotide 145 MCG Caps capsule ?Commonly known as: LINZESS ?Take 145 mcg by mouth as needed (constipation). ?  ?methocarbamol 500 MG tablet ?Commonly known as: ROBAXIN ?Take 1 tablet (500 mg total) by mouth every 6 (six) hours as needed for muscle spasms. ?  ?ondansetron 4 MG disintegrating tablet ?Commonly known as: Zofran ODT ?Take 1 tablet (4 mg total) by mouth every 8 (eight) hours as needed. ?  ?pantoprazole 40 MG tablet ?Commonly known as: PROTONIX ?TAKE 1 TABLET BY MOUTH EVERY DAY ?  ?polyethylene glycol 17 g packet ?Commonly known as: MiraLax ?Take 17 g by mouth daily as needed for mild constipation or moderate constipation. ?  ?rizatriptan 10 MG disintegrating tablet ?Commonly known as: Maxalt-MLT ?Take 1 tablet (10 mg total) by mouth as needed. May repeat in 2 hours if needed ?  ?senna 8.6 MG Tabs tablet ?Commonly known as: SENOKOT ?Take 1 tablet (8.6 mg total) by mouth 2 (two) times daily. ?  ?triamcinolone cream 0.1 % ?Commonly known as: KENALOG ?Apply 1 application topically 2 (two) times daily as needed for itching. ?  ?Venlafaxine HCl 225 MG Tb24 ?Take 225 mg by mouth daily. ?  ?Vitamin D (Ergocalciferol) 1.25 MG (50000 UNIT) Caps capsule ?Commonly known as: DRISDOL ?Take 50,000 Units by mouth every Wednesday. ?  ? ?  ? ?  ?  ? ? ?  ?Discharge Care Instructions  ?(From admission, onward)  ?  ? ? ?  ? ?  Start     Ordered  ? 05/15/21 0000  Weight bearing as tolerated       ? 05/15/21 0718  ? 05/15/21 0000  Change dressing       ?Comments: Do not remove your dressing.  ? 05/15/21 0718  ? ?  ?  ? ?  ?  ? ? ?WEIGHT BEARING  ? ?Weight bearing as tolerated with assist  device (walker, cane, etc) as directed, use it as long as suggested by your surgeon or therapist, typically at least 4-6 weeks. ? ? ?EXERCISES ? ?Results after joint replacement surgery are often greatly improved when you follow the exercise, range of motion and muscle strengthening exercises prescribed by your doctor. Safety measures are also important to protect the joint from further injury. Any time any of these exercises  cause you to have increased pain or swelling, decrease what you are doing until you are comfortable again and then slowly increase them. If you have problems or questions, call your caregiver or physical therapist for advice.  ? ?Rehabilitation is important following a joint replacement. After just a few days of immobilization, the muscles of the leg can become weakened and shrink (atrophy).  These exercises are designed to build up the tone and strength of the thigh and leg muscles and to improve motion. Often times heat used for twenty to thirty minutes before working out will loosen up your tissues and help with improving the range of motion but do not use heat for the first two weeks following surgery (sometimes heat can increase post-operative swelling).  ? ?These exercises can be done on a training (exercise) mat, on the floor, on a table or on a bed. Use whatever works the best and is most comfortable for you.    Use music or television while you are exercising so that the exercises are a pleasant break in your day. This will make your life better with the exercises acting as a break in your routine that you can look forward to.   Perform all exercises about fifteen times, three times per day or as directed.  You should exercise both the operative leg and the other leg as well. ? ?Exercises include: ?  ?Quad Sets - Tighten up the muscle on the front of the thigh (Quad) and hold for 5-10 seconds.   ?Straight Leg Raises - With your knee straight (if you were given a brace, keep it on),  lift the leg to 60 degrees, hold for 3 seconds, and slowly lower the leg.  Perform this exercise against resistance later as your leg gets stronger.  ?Leg Slides: Lying on your back, slowly slide your foot toward y

## 2021-05-29 ENCOUNTER — Encounter: Payer: Self-pay | Admitting: Gastroenterology

## 2021-05-29 ENCOUNTER — Ambulatory Visit (INDEPENDENT_AMBULATORY_CARE_PROVIDER_SITE_OTHER): Payer: PRIVATE HEALTH INSURANCE | Admitting: Gastroenterology

## 2021-05-29 ENCOUNTER — Other Ambulatory Visit (INDEPENDENT_AMBULATORY_CARE_PROVIDER_SITE_OTHER): Payer: PRIVATE HEALTH INSURANCE

## 2021-05-29 VITALS — BP 140/78 | HR 101 | Ht 62.0 in | Wt 186.4 lb

## 2021-05-29 DIAGNOSIS — K219 Gastro-esophageal reflux disease without esophagitis: Secondary | ICD-10-CM

## 2021-05-29 DIAGNOSIS — K581 Irritable bowel syndrome with constipation: Secondary | ICD-10-CM

## 2021-05-29 DIAGNOSIS — R7989 Other specified abnormal findings of blood chemistry: Secondary | ICD-10-CM | POA: Diagnosis not present

## 2021-05-29 DIAGNOSIS — R1013 Epigastric pain: Secondary | ICD-10-CM

## 2021-05-29 MED ORDER — PANTOPRAZOLE SODIUM 40 MG PO TBEC
40.0000 mg | DELAYED_RELEASE_TABLET | Freq: Every day | ORAL | 0 refills | Status: AC
Start: 2021-05-29 — End: ?

## 2021-05-29 NOTE — Progress Notes (Signed)
? ? ?Chief Complaint: Abdominal pain ? ?Referring Provider:  Hadley Pen, MD    ? ? ?ASSESSMENT AND PLAN;  ? ?#1. Postprandial GERD and epi/LUQ pain. ? ?#2. IBS-C. Neg colon 10/2018 (next due in 10 yrs) ? ?#3. Abn LFTs d/t fatty liver (likely)./ Dx with pre-DM (Dr Sherral Hammers). Recent Covid-19 08/2018 ? ?Plan: ?- Start Miralax 17g po BID until large BM, then QD ?- CBC, CMP, lipase, HBA1c ?- CT AP with contrast ?- Protonix 40mg  po qd (#30), 6 refills ?- FU in 24 weeks. ?- D/W husband. ? ? ? ?HPI:   ? ?Mandy Grant is a 61 y.o. female  ?For FU ? ?S/P LKR 2 weeks ago, has been taking intermittent Vicodin ? ?C/O worsening epi/LUQ pain with associated bloating, increasing constipation.  Nausea but no vomiting.  Still has heartburn despite Protonix.  Has been passing flatus.  No melena or hematochezia.  She has been taking Linzess but continues to be constipated.  She does drink plenty of water. ? ?No fever or chills. ? ?No odynophagia or dysphagia.  No jaundice dark urine or pale stools. ? 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 ? ? ? ?From previous notes: ?Has been having postprandial epigastric and left upper quadrant abdominal pain with abdominal bloating. "Feels like there is a baby on her left side".  Associated with abdominal bloating and constipation.  Would have pellet-like stools at the frequency of 1-2 times per week.  She has been drinking plenty of water.  No nonsteroidals.  When she takes Linzess 145 mcg, she has diarrhea lasting for 4 to 5 hours " just like somebody opened a faucet" ? ?Wt Readings from Last 3 Encounters:  ?05/29/21 186 lb 6 oz (84.5 kg)  ?05/14/21 180 lb 12.8 oz (82 kg)  ?05/07/21 180 lb 12.8 oz (82 kg)  ? ? ? ? ?Past GI procedures: ? ?EGD 11/06/2018 ?-Mild gastritis. Neg HP ?-Otherwise normal EGD. ?-Neg SB bx for celiac. ? ?Colonoscopy 11/06/2018 ?- Small internal hemorrhoids. ?- Otherwise normal colonoscopy to TI. ?- Rpt in 10 yrs. Earlier, if with any  new problems. ? ?-Colonoscopy 11/2012 ([facility])-colonic polyp S/P polypectomy, Bx- TA.,  Small internal hemorrhoids.  Otherwise normal to TI. ? 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 Adjustment reaction with anxiety and depression   ? Anxiety   ? Arthritis   ? Chronic gastritis   ? Complication of anesthesia   ? "hard time waking up"  ? COVID-19 virus infection   ? Depression   ? Fibromyalgia   ? GERD (gastroesophageal reflux disease)   ? Headache(784.0)   ? constant with nausea  ? Hypercholesteremia   ? IBS (irritable bowel syndrome)   ? Migraine   ? OA (osteoarthritis)   ? PONV (postoperative nausea and vomiting)   ? Pre-diabetes   ? Psoriatic arthritis (HCC)   ? ? ?Past Surgical History:  ?Procedure Laterality Date  ? ANKLE FRACTURE SURGERY Right 09  ? plates screws  ? ANTERIOR CERVICAL DECOMP/DISCECTOMY FUSION N/A 08/07/2013  ? Procedure: CERVICAL SIX-SEVEN ANTERIOR CERVICAL DECOMPRESSION FUSION WITH PEEK CAGE,PLATING.;  Surgeon: 08/09/2013, MD;  Location: [facility];  Service: Neurosurgery;  Laterality: N/A;  ? CERVICAL DISC SURGERY  02  ? COLONOSCOPY  11/22/2012  ? Colonic polyp, status post ploypectomy. Small internal hemorrhoids. Otherwise normal colonoscopy to terminal ileum  ? ESOPHAGOGASTRODUODENOSCOPY  09/20/2012  ? Mild gastritis. Minimal scalloping od duodenal  musosa of questionable importance (biopsied)  ? KNEE SURGERY    ? TOTAL KNEE ARTHROPLASTY WITH REVISION COMPONENTS Left 05/14/2021  ? Procedure: Revision patella component vs complete revision;  Surgeon: Samson FredericSwinteck, Brian, MD;  Location: [facility];  Service: Orthopedics;  Laterality: Left;  ? ? ?Family History  ?Problem Relation Age of Onset  ? Hypertension Mother   ? Other Father   ?     unsure of medical history  ? Diabetes Sister   ? Prostate cancer Maternal Grandfather   ? Colon cancer Neg Hx   ? Esophageal cancer Neg Hx   ? Rectal cancer Neg Hx   ? Stomach cancer Neg Hx   ? ? ?Social History   ? ?Tobacco Use  ? Smoking status: Former  ?  Packs/day: 0.50  ?  Years: 4.00  ?  Pack years: 2.00  ?  Types: Cigarettes  ?  Quit date: 07/28/1986  ?  Years since quitting: 34.8  ? Smokeless tobacco: Never  ?Vaping Use  ? Vaping Use: Never used  ?Substance Use Topics  ? Alcohol use: No  ? Drug use: No  ? ? ?Current Outpatient Medications  ?Medication Sig Dispense Refill  ? ARTIFICIAL TEAR SOLUTION OP Place 1 drop into both eyes daily as needed (dry eyes).    ? aspirin (ASPIRIN CHILDRENS) 81 MG chewable tablet Chew 1 tablet (81 mg total) by mouth 2 (two) times daily with a meal. 90 tablet 0  ? diphenhydrAMINE (BENADRYL) 25 MG tablet Take 12.5 mg by mouth at bedtime as needed for allergies.    ? docusate sodium (COLACE) 100 MG capsule Take 1 capsule (100 mg total) by mouth 2 (two) times daily. 60 capsule 0  ? ezetimibe (ZETIA) 10 MG tablet Take 10 mg by mouth daily.    ? fluticasone (FLONASE) 50 MCG/ACT nasal spray Place 1 spray into both nostrils daily as needed for allergies.    ? gabapentin (NEURONTIN) 100 MG capsule TAKE 3 CAPSULES BY MOUTH AT BEDTIME. (Patient taking differently: Take 200 mg by mouth 3 (three) times daily.) 270 capsule 1  ? hydroxychloroquine (PLAQUENIL) 200 MG tablet Take 200 mg by mouth 2 (two) times daily.    ? hydrOXYzine (ATARAX) 25 MG tablet Take 12.5 mg by mouth at bedtime as needed for itching.    ? linaclotide (LINZESS) 145 MCG CAPS capsule Take 145 mcg by mouth as needed (constipation).    ? methocarbamol (ROBAXIN) 500 MG tablet Take 1 tablet (500 mg total) by mouth every 6 (six) hours as needed for muscle spasms. 30 tablet 0  ? ondansetron (ZOFRAN ODT) 4 MG disintegrating tablet Take 1 tablet (4 mg total) by mouth every 8 (eight) hours as needed. 20 tablet 6  ? pantoprazole (PROTONIX) 40 MG tablet TAKE 1 TABLET BY MOUTH EVERY DAY 90 tablet 0  ? polyethylene glycol (MIRALAX) 17 g packet Take 17 g by mouth daily as needed for mild constipation or moderate constipation. 14 each 0  ?  rizatriptan (MAXALT-MLT) 10 MG disintegrating tablet Take 1 tablet (10 mg total) by mouth as needed. May repeat in 2 hours if needed 15 tablet 6  ? triamcinolone cream (KENALOG) 0.1 % Apply 1 application topically 2 (two) times daily as needed for itching.    ? Venlafaxine HCl 225 MG TB24 Take 225 mg by mouth daily.    ? Vitamin D, Ergocalciferol, (DRISDOL) 1.25 MG (50000 UT) CAPS capsule Take 50,000 Units by mouth every Wednesday.    ? ?No current facility-administered  medications for this visit.  ? ? ?Allergies  ?Allergen Reactions  ? Levofloxacin Other (See Comments)  ?  Felt rotten, "felt like I was run over"  ? Percocet [Oxycodone-Acetaminophen] Palpitations  ?  Skin crawling, tolerates hydrocodone   ? Sulfa Antibiotics Nausea And Vomiting and Rash  ? ? ?Review of Systems:  ?Constitutional: Denies fever, chills, diaphoresis, appetite change and has fatigue.  ?HEENT: Cannot smell.  Taste has returned. ?Respiratory: Denies SOB, DOE, cough, chest tightness,  and wheezing.   ?Cardiovascular: Denies chest pain, palpitations and leg swelling.  ?Genitourinary: Denies dysuria, urgency, frequency, hematuria, flank pain and difficulty urinating.  ?Musculoskeletal: Has myalgias, back pain, joint swelling, arthralgias and gait problem.  ?Skin: No rash.  ?Neurological: Denies dizziness, seizures, syncope, weakness, light-headedness, numbness and has headaches.  ?Hematological: Denies adenopathy. Easy bruising, personal or family bleeding history  ?Psychiatric/Behavioral: Has anxiety or depression ? ?  ? ?Physical Exam:   ? ?BP 140/78   Pulse (!) 101   Ht 5\' 2"  (1.575 m)   Wt 186 lb 6 oz (84.5 kg)   LMP  (LMP Unknown)   SpO2 98%   BMI 34.09 kg/m?  ?Filed Weights  ? 05/29/21 1409  ?Weight: 186 lb 6 oz (84.5 kg)  ? ?Constitutional:  Well-developed, in no acute distress. ?Psychiatric: Normal mood and affect. Behavior is normal. ?HEENT: Pupils normal.  Conjunctivae are normal. No scleral icterus. ?Cardiovascular: Normal  rate, regular rhythm. No edema ?Pulmonary/chest: Effort normal and breath sounds normal. No wheezing, rales or rhonchi. ?Abdominal: Soft, nondistended.  Epi/LUQ tenderness. bowel sounds active throughout. There are

## 2021-05-29 NOTE — Patient Instructions (Addendum)
If you are age 60 or older, your body mass index should be between 23-30. Your Body mass index is 34.09 kg/m?Marland Kitchen If this is out of the aforementioned range listed, please consider follow up with your Primary Care Provider. ? ?If you are age 18 or younger, your body mass index should be between 19-25. Your Body mass index is 34.09 kg/m?Marland Kitchen If this is out of the aformentioned range listed, please consider follow up with your Primary Care Provider.  ? ?________________________________________________________ ? ?The [facility] providers would like to encourage you to use [facility] to communicate with providers for non-urgent requests or questions.  Due to long hold times on the telephone, sending your provider a message by [facility] may be a faster and more efficient way to get a response.  Please allow 48 business hours for a response.  Please remember that this is for non-urgent requests.  ?_______________________________________________________ ? ?Please go to the lab on the 2nd floor [address] before you leave the office today.  ? ?We have sent the following medications to your pharmacy for you to pick up at your convenience: ?Protonix ? ?Please purchase the following medications over the counter and take as directed: ?Miralax 17g 2 times a day until large bowel movement then Miralax daily ? ?Please go to [address] to schedule and get the contrast.  ?You have been scheduled [facility] [place] [address] 1[facility] Rad[facility]).  ? ?You are scheduled on           at           . You should arrive 15 minutes prior to your appointment time for registration. Please follow the written instructions below on the day of your exam: ? ?WARNING: IF YOU ARE ALLERGIC TO IODINE/X-RAY DYE, PLEASE NOTIFY RADIOLOGY IMMEDIATELY AT [phone]! YOU WILL BE GIVEN A 13 HOUR PREMEDICATION PREP. ? ?1) Do not eat or drink anything after          (4 hours prior to your  test) ?2) You have been given 2 bottles of oral contrast to drink. The solution may taste better if refrigerated, but do NOT add ice or any other liquid to this solution. Shake well before drinking. ?  ? Drink 1 bottle of contrast @          (2 hours prior to your exam) ? Drink 1 bottle of contrast @          (1 hour prior to your exam) ? ?You may take any medications as prescribed with a small amount of water, if necessary. If you take any of the following medications: METFORMIN, GLUCOPHAGE, GLUCOVANCE, AVANDAMET, RIOMET, FORTAMET, ACTOPLUS MET, JANUMET, [place] or METAGLIP, you MAY be asked to HOLD this medication 48 hours AFTER the exam. ? ?The purpose of you drinking the oral contrast is to aid in the visualization of your intestinal tract. The contrast solution may cause some diarrhea. Depending on your individual set of symptoms, you may also receive an intravenous injection of x-ray contrast/dye. Plan on being at [facility] for 30 minutes or longer, depending on the type of exam you are having performed. ? ?This test typically takes 30-45 minutes to complete. ? ?If you have any questions regarding your exam or if you need to reschedule, you may call the CT department at [phone] between the hours of 8:00 am and 5:00 pm, Monday-Friday. ? ?________________________________________________________________________ ? ?Thank  you, ? ?Dr. Jackquline Denmark ? ? ? ? ? ? ?We want to thank you for trusting [facility] with your care. All of our staff and providers value the relationships we have built with our patients, and it is an honor to care for you.  ? ?We are writing to let you know that [facility] will close on Jun 01, 2021, and we invite you to continue to see Dr. Carmell Austria and Gerrit Heck at the [facility] [place] office location. We are consolidating our serices at these [facility] practices to better provide care. Our office staff will  work with you to ensure a seamless transition.  ? ?Gerrit Heck, DO -Dr. Bryan Lemma will be movig to [facility] at [address] [place], [place] [address], effective Jun 01, 2021.  Contact [phone] to schedule an appointment with him.  ? ?Carmell Austria, MD- Dr. Lyndel Safe will be movig to [facility] at [address], [place], [place] [address], effective Jun 01, 2021.  Contact [phone] to schedule an appointment with him.  ? ?Requesting Medical Records ?If you need to request your medical records, please follow the instructions below. Your medical records are confidential, and a copy can be transferred to another provider or released to you or another person you designate only with your permission. ? ?There are several ways to request your medical records: ?Requests for medical records can be submitted through our practice.   ?You can also request your records electronically, in your MyChart account by selecting the ?Request Health Records? tab.  ?If you need additional information on how to request records, please go to [email or web address]/, choose Patient Information, then select Request Medical Records. ?To make an appointment or if you have any questions about your health care needs, please contact our office at [phone] and one of our staff members will be glad to assist you. ?[facility] is committed to providing exceptional care for you and our community. Thank you for allowing [place] to serve your health care needs. ?Sincerely, ? ?Windy Canny, Director [facility] ?[facility] also offers convenient virtual care options. Sore throat? Sinus problems? Cold or flu symptoms? Get care from the comfort of home with [facility] Video Visits and e-Visits. Learn more about the non-emergency conditions treated and start your virtual visit at [email or web address] ? ? ? ?

## 2021-05-30 LAB — COMPREHENSIVE METABOLIC PANEL
ALT: 39 IU/L — ABNORMAL HIGH (ref 0–32)
AST: 24 IU/L (ref 0–40)
Albumin/Globulin Ratio: 1.7 (ref 1.2–2.2)
Albumin: 4.3 g/dL (ref 3.8–4.9)
Alkaline Phosphatase: 89 IU/L (ref 44–121)
BUN/Creatinine Ratio: 15 (ref 9–23)
BUN: 13 mg/dL (ref 6–24)
Bilirubin Total: 0.2 mg/dL (ref 0.0–1.2)
CO2: 23 mmol/L (ref 20–29)
Calcium: 9.6 mg/dL (ref 8.7–10.2)
Chloride: 102 mmol/L (ref 96–106)
Creatinine, Ser: 0.88 mg/dL (ref 0.57–1.00)
Globulin, Total: 2.5 g/dL (ref 1.5–4.5)
Glucose: 108 mg/dL — ABNORMAL HIGH (ref 70–99)
Potassium: 4.3 mmol/L (ref 3.5–5.2)
Sodium: 139 mmol/L (ref 134–144)
Total Protein: 6.8 g/dL (ref 6.0–8.5)
eGFR: 76 mL/min/{1.73_m2} (ref >59)

## 2021-05-30 LAB — HEMOGLOBIN A1C
Est. average glucose Bld gHb Est-mCnc: 126 mg/dL
Hgb A1c MFr Bld: 6 % — ABNORMAL HIGH (ref 4.8–5.6)

## 2021-05-30 LAB — CBC WITH DIFFERENTIAL/PLATELET
Basophils Absolute: 0.1 10*3/uL (ref 0.0–0.2)
Basos: 1 %
EOS (ABSOLUTE): 0.5 10*3/uL — ABNORMAL HIGH (ref 0.0–0.4)
Eos: 5 %
Hematocrit: 37.2 % (ref 34.0–46.6)
Hemoglobin: 12.7 g/dL (ref 11.1–15.9)
Immature Grans (Abs): 0 10*3/uL (ref 0.0–0.1)
Immature Granulocytes: 0 %
Lymphocytes Absolute: 2.6 10*3/uL (ref 0.7–3.1)
Lymphs: 24 %
MCH: 30 pg (ref 26.6–33.0)
MCHC: 34.1 g/dL (ref 31.5–35.7)
MCV: 88 fL (ref 79–97)
Monocytes Absolute: 0.8 10*3/uL (ref 0.1–0.9)
Monocytes: 7 %
Neutrophils Absolute: 6.9 10*3/uL (ref 1.4–7.0)
Neutrophils: 63 %
Platelets: 420 10*3/uL (ref 150–450)
RBC: 4.24 x10E6/uL (ref 3.77–5.28)
RDW: 13 % (ref 11.7–15.4)
WBC: 10.9 10*3/uL — ABNORMAL HIGH (ref 3.4–10.8)

## 2021-05-30 LAB — LIPASE: Lipase: 71 U/L (ref 14–72)

## 2021-06-17 ENCOUNTER — Ambulatory Visit (HOSPITAL_BASED_OUTPATIENT_CLINIC_OR_DEPARTMENT_OTHER)
Admission: RE | Admit: 2021-06-17 | Discharge: 2021-06-17 | Disposition: A | Discharge status: Home / Self Care | Payer: PRIVATE HEALTH INSURANCE | Source: Ambulatory Visit | Attending: Gastroenterology | Admitting: Gastroenterology

## 2021-06-17 ENCOUNTER — Encounter (HOSPITAL_BASED_OUTPATIENT_CLINIC_OR_DEPARTMENT_OTHER): Payer: Self-pay

## 2021-06-17 DIAGNOSIS — K219 Gastro-esophageal reflux disease without esophagitis: Secondary | ICD-10-CM | POA: Diagnosis present

## 2021-06-17 DIAGNOSIS — R1013 Epigastric pain: Secondary | ICD-10-CM | POA: Insufficient documentation

## 2021-06-17 DIAGNOSIS — K581 Irritable bowel syndrome with constipation: Secondary | ICD-10-CM | POA: Insufficient documentation

## 2021-06-17 MED ORDER — IOHEXOL 300 MG/ML  SOLN
100.0000 mL | Freq: Once | INTRAMUSCULAR | Status: AC | PRN
  Administered 2021-06-17: 100 mL via INTRAVENOUS

## 2021-08-21 ENCOUNTER — Ambulatory Visit (INDEPENDENT_AMBULATORY_CARE_PROVIDER_SITE_OTHER): Payer: PRIVATE HEALTH INSURANCE | Admitting: Internal Medicine

## 2021-08-21 ENCOUNTER — Encounter: Payer: Self-pay | Admitting: Internal Medicine

## 2021-08-21 ENCOUNTER — Other Ambulatory Visit: Payer: Self-pay | Admitting: Internal Medicine

## 2021-08-21 VITALS — BP 124/76 | HR 85 | Temp 98.7°F | Resp 18 | Ht 62.0 in | Wt 191.4 lb

## 2021-08-21 DIAGNOSIS — L299 Pruritus, unspecified: Secondary | ICD-10-CM

## 2021-08-21 DIAGNOSIS — J3089 Other allergic rhinitis: Secondary | ICD-10-CM

## 2021-08-21 DIAGNOSIS — L280 Lichen simplex chronicus: Secondary | ICD-10-CM | POA: Diagnosis not present

## 2021-08-21 MED ORDER — CETIRIZINE HCL 10 MG PO TABS: 20.0000 mg | ORAL_TABLET | Freq: Two times a day (BID) | ORAL | 1 refills | Status: DC

## 2021-08-21 MED ORDER — MONTELUKAST SODIUM 10 MG PO TABS: 10.0000 mg | ORAL_TABLET | Freq: Every day | ORAL | 1 refills | Status: DC

## 2021-08-21 NOTE — Patient Instructions (Addendum)
Chronic Idiopathic Pruritus:  Testing today showed:  negative to environmetals and common foods  -Some pictures are consistent with urticaria however some skin changes are due to chronic itching -We will go ahead and do chronic hive work-up and go from there: H. pylori, tryptase, chronic urticaria index, alpha-gal panel - pruritus can be from a number of different sources including infections, allergies, vibration, temperature, pressure among many others other possible causes - often an identifiable cause is not determined - some potential triggers include: stress, illness, NSAIDs, aspirin, hormonal changes - Keeping skin well moisturized can help reduce any cause of itch   Therapy Plan:  - Start zyrtec (cetirizine) 20 mg twice daily -Start Singulair 10 mg daily - can increase or decrease dosing depending on symptom control to a maximum dose of 4 tablets of antihistamine daily. Wait until hives/itch  free for at least one month prior to decreasing dose.   - if hives/itch are still uncontrolled with the above regimen, please arrange an appointment for discussion of Xolair (omalizumab)- an injectable medication for hives  Nonallergic Rhinitis: well  controlled  - Testing today showed negative to grass, weeds, trees, molds, dust mite, cat, dog, mixed feathers, horse, roach, mouse, mixed tobacco leaf - Copy of test results provided.  - Avoidance measures provided. - Start taking:  Zyrtec 20mg  twice daily and Singulair (montelukast) 10mg  daily (this will help rhinitis and pruritus symptoms   - You can use an extra dose of the antihistamine, if needed, for breakthrough symptoms.  - Consider nasal saline rinses 1-2 times daily to remove allergens from the nasal cavities as well as help with mucous clearance (this is especially helpful to do before the nasal sprays are given)  Follow up: 4 weeks   Thank you so much for letting me partake in your care today.  Don't hesitate to reach out if you have  any additional concerns!  , MD  [facility] [place] [place]

## 2021-08-21 NOTE — Progress Notes (Signed)
New Patient Note  RE: Mandy Grant Salvador MRN: [id number] DOB: 08/06/1960 Date of Office Visit: 08/21/2021  Consult requested by: Hadley Penobbins, Robert A, MD Primary care provider: Hadley Penobbins, Robert A, MD  Chief Complaint: Establish Care and Urticaria  History of Present Illness: I had the pleasure of seeing Mandy Grant for initial evaluation at the [facility] on 08/21/2021. She is a 61 y.o. female, who is referred here by Hadley Penobbins, Robert A, MD for the evaluation of diffuse pruritus.  History obtained from patient  and  chart review .  Symptoms first started around DEC 23.  Symptoms occur on breast, groin, arms.  She has had complications from excoriation  She has tried antihistamines without good control (1 qAM and 1qPM).  She tried triamcinolone and clobetasol with some relief.  Symptoms will occur every day.    She was on Humira for psoriatic arthritis and initial concern symptoms were due to Humira injections.  However after stopping this medication symptoms persisted.  She has been seen by dermatology and skin biopsy from 03/18/2021 is consistent with lichen simplex chronicus.    Recent thyroid panel, CMP were normal.  She did have an AEC of 500 on recent CBC  Chronic rhinitis: started as adult  Symptoms include: rhinorrhea, sneezing, and itchy nose  Occurs seasonally-spring and fall  Potential triggers: pollen season  Treatments tried: allegra  Previous allergy testing: no History of reflux/heartburn: yes: worsening heart burn over the past few months  History of chronic sinusitis or sinus surgery: no Nonallergic triggers:  denies      Assessment and Plan: Mandy Grant is a 61 y.o. female with: Pruritus - Plan: Tryptase, Chronic Urticaria, H Pylori, IGM, IGG, IGA AB, Alpha-Gal Panel, Allergy Test  Lichen simplex chronicus - Plan: Allergy Test  Other allergic rhinitis - Plan: Allergy Test Plan: Patient Instructions  Chronic Idiopathic Pruritus:  Testing today showed:   negative to environmetals and common foods  -Some pictures are consistent with urticaria however some skin changes are due to chronic itching -We will go ahead and do chronic hive work-up and go from there: H. pylori, tryptase, chronic urticaria index, alpha-gal panel - pruritus can be from a number of different sources including infections, allergies, vibration, temperature, pressure among many others other possible causes - often an identifiable cause is not determined - some potential triggers include: stress, illness, NSAIDs, aspirin, hormonal changes - Keeping skin well moisturized can help reduce any cause of itch   Therapy Plan:  - Start zyrtec (cetirizine) 20 mg twice daily -Start Singulair 10 mg daily - can increase or decrease dosing depending on symptom control to a maximum dose of 4 tablets of antihistamine daily. Wait until hives/itch  free for at least one month prior to decreasing dose.   - if hives/itch are still uncontrolled with the above regimen, please arrange an appointment for discussion of Xolair (omalizumab)- an injectable medication for hives  Nonallergic Rhinitis: well  controlled  - Testing today showed negative to grass, weeds, trees, molds, dust mite, cat, dog, mixed feathers, horse, roach, mouse, mixed tobacco leaf - Copy of test results provided.  - Avoidance measures provided. - Start taking:  Zyrtec 20mg  twice daily and Singulair (montelukast) 10mg  daily (this will help rhinitis and pruritus symptoms   - You can use an extra dose of the antihistamine, if needed, for breakthrough symptoms.  - Consider nasal saline rinses 1-2 times daily to remove allergens from the nasal cavities as well as help with  mucous clearance (this is especially helpful to do before the nasal sprays are given)  Follow up: 4 weeks   Thank you so much for letting me partake in your care today.  Don't hesitate to reach out if you have any additional concerns!  Ferol Luz, MD   [facility] [place] [place]   No follow-ups on file.  Meds ordered this encounter  Medications   cetirizine (ZYRTEC) 10 MG tablet    Sig: Take 2 tablets (20 mg total) by mouth 2 (two) times daily.    Dispense:  90 tablet    Refill:  1   montelukast (SINGULAIR) 10 MG tablet    Sig: Take 1 tablet (10 mg total) by mouth at bedtime.    Dispense:  90 tablet    Refill:  1   Lab Orders         Tryptase         Chronic Urticaria         H Pylori, IGM, IGG, IGA AB         Alpha-Gal Panel      Other allergy screening: Asthma: no Rhino conjunctivitis: yes Food allergy: no Medication allergy: no Hymenoptera allergy: no Urticaria: yes Eczema:no History of recurrent infections suggestive of immunodeficency: no  Diagnostics:   Skin Testing: Environmental allergy panel and select foods. Adeqaute controls, results as below  Results interpreted by myself and discussed with patient/family.  Airborne Adult Perc - 08/21/21 1510     Time Antigen Placed 1445    Allergen Manufacturer Waynette Buttery    Location Back    Number of Test 69    1. Control-Buffer 50% Glycerol Negative    2. Control-Histamine 1 mg/ml Negative    3. Albumin saline Negative    4. Bahia Negative    5. French Southern Territories Negative    6. Johnson Negative    7. Kentucky Blue Negative    8. Meadow Fescue Negative    9. Perennial Rye Negative    10. Sweet Vernal Negative    11. Timothy Negative    12. Cocklebur Negative    13. Burweed Marshelder Negative    14. Ragweed, short Negative    15. Ragweed, Giant Negative    16. Plantain,  English Negative    17. Lamb's Quarters Negative    18. Sheep Sorrell Negative    19. Rough Pigweed Negative    20. Marsh Elder, Rough Negative    21. Mugwort, Common Negative    22. Ash mix Negative    23. Birch mix Negative    24. Beech American Negative    25. Box, Elder Negative    26. Cedar, red Negative    27. Cottonwood, Guinea-Bissau Negative    28. Elm mix Negative    29.  Hickory Negative    30. Maple mix Negative    31. Oak, Guinea-Bissau mix Negative    32. Pecan Pollen Negative    33. Pine mix Negative    34. Sycamore Eastern Negative    35. Walnut, Black Pollen Negative    36. Alternaria alternata Negative    37. Cladosporium Herbarum Negative    38. Aspergillus mix Negative    39. Penicillium mix Negative    40. Bipolaris sorokiniana (Helminthosporium) Negative    41. Drechslera spicifera (Curvularia) Negative    42. Mucor plumbeus Negative    43. Fusarium moniliforme Negative    44. Aureobasidium pullulans (pullulara) Negative    45. Rhizopus oryzae Negative  46. Botrytis cinera Negative    47. Epicoccum nigrum Negative    48. Phoma betae Negative    49. Candida Albicans Negative    50. Trichophyton mentagrophytes Negative    51. Mite, D Farinae  5,000 AU/ml Negative    52. Mite, D Pteronyssinus  5,000 AU/ml Negative    53. Cat Hair 10,000 BAU/ml Negative    54.  Dog Epithelia Negative    55. Mixed Feathers Negative    56. Horse Epithelia Negative    57. Cockroach, German Negative    58. Mouse Negative    59. Tobacco Leaf Negative             Food Perc - 08/21/21 1511       Test Information   Time Antigen Placed 1445    Allergen Manufacturer Waynette Buttery    Location Back    Number of allergen test 10      Food   1. Peanut Negative    2. Soybean food Negative    3. Wheat, whole Negative    4. Sesame Negative    5. Milk, cow Negative    6. Egg White, chicken Negative    7. Casein Negative    8. Shellfish mix Negative    9. Fish mix Negative    10. 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   Osteoarthritis of right knee 05/12/2021   Pain in joint of left knee 02/12/2020   Synovial cyst of left popliteal space 01/07/2020   Synovial cyst of right popliteal space  01/07/2020   Chronic migraine w/o aura w/o status migrainosus, not intractable 11/28/2019   Chronic migraine 08/22/2019   Anxiety 08/22/2019   Neck pain 08/22/2019   Persistent headaches 08/22/2019   Bilateral knee pain 08/20/2019   Nausea 07/06/2019   Dysfunction of both eustachian tubes 06/19/2019   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   Arthralgia of multiple joints 10/01/2018   Myalgia 10/01/2018   Bilateral wheezing 09/22/2018   Malaise and fatigue 09/22/2018   Shortness of breath 09/21/2018   Chronic pain of both knees 07/19/2018   Swelling of joint of right knee 07/19/2018   Benign colon polyp 08/12/2016   Overweight and obesity 08/12/2016   Environmental and seasonal allergies 08/12/2016   Mixed dyslipidemia 08/12/2016   Osteoarthritis of left hand 08/12/2016   Type 2 diabetes mellitus with hyperglycemia, without long-term current use of insulin (HCC) 08/12/2016   Vitamin D deficiency 08/12/2016   HNP (herniated nucleus pulposus), cervical 08/07/2013   Herniation of cervical intervertebral disc with radiculopathy 08/07/2013   Herniation of nucleus pulposus of cervical intervertebral disc without myelopathy 08/07/2013   Past Medical History:  Diagnosis Date   Adjustment reaction with anxiety and depression    Anxiety    Arthritis    Chronic gastritis    Complication of anesthesia    "hard time waking up"   COVID-19 virus infection    Depression    Fibromyalgia    GERD (gastroesophageal reflux disease)    Headache(784.0)    constant with nausea   Hypercholesteremia    IBS (irritable bowel syndrome)    Migraine    OA (osteoarthritis)    PONV (postoperative nausea and vomiting)    Pre-diabetes    Psoriatic arthritis (HCC)    Type 2  diabetes mellitus with hyperglycemia, without long-term current use of insulin (HCC) 08/12/2016   Past Surgical History: Past Surgical History:  Procedure Laterality Date   ANKLE FRACTURE SURGERY Right 09   plates screws   ANTERIOR  CERVICAL DECOMP/DISCECTOMY FUSION N/A 08/07/2013   Procedure: CERVICAL SIX-SEVEN ANTERIOR CERVICAL DECOMPRESSION FUSION WITH PEEK CAGE,PLATING.;  Surgeon: Temple Pacini, MD;  Location: [facility];  Service: Neurosurgery;  Laterality: N/A;   CERVICAL DISC SURGERY  02   COLONOSCOPY  11/22/2012   Colonic polyp, status post ploypectomy. Small internal hemorrhoids. Otherwise normal colonoscopy to terminal ileum   ESOPHAGOGASTRODUODENOSCOPY  09/20/2012   Mild gastritis. Minimal scalloping od duodenal musosa of questionable importance (biopsied)   KNEE SURGERY     TOTAL KNEE ARTHROPLASTY WITH REVISION COMPONENTS Left 05/14/2021   Procedure: Revision patella component vs complete revision;  Surgeon: Samson Frederic, MD;  Location: [facility];  Service: Orthopedics;  Laterality: Left;   Medication List:  Current Outpatient Medications  Medication Sig Dispense Refill   ARTIFICIAL TEAR SOLUTION OP Place 1 drop into both eyes daily as needed (dry eyes).     cetirizine (ZYRTEC) 10 MG tablet Take 2 tablets (20 mg total) by mouth 2 (two) times daily. 90 tablet 1   diphenhydrAMINE (BENADRYL) 25 MG tablet Take 12.5 mg by mouth at bedtime as needed for allergies.     ezetimibe (ZETIA) 10 MG tablet Take 10 mg by mouth daily.     fluticasone (FLONASE) 50 MCG/ACT nasal spray Place 1 spray into both nostrils daily as needed for allergies.     gabapentin (NEURONTIN) 100 MG capsule TAKE 3 CAPSULES BY MOUTH AT BEDTIME. (Patient taking differently: Take 200 mg by mouth 3 (three) times daily.) 270 capsule 1   hydroxychloroquine (PLAQUENIL) 200 MG tablet Take 200 mg by mouth 2 (two) times daily.     hydrOXYzine (ATARAX) 25 MG tablet Take 12.5 mg by mouth at bedtime as needed for itching.     linaclotide (LINZESS) 145 MCG CAPS capsule Take 145 mcg by mouth as needed (constipation).     montelukast (SINGULAIR) 10 MG tablet Take 1 tablet (10 mg total) by mouth at bedtime. 90 tablet 1   ondansetron (ZOFRAN ODT) 4 MG  disintegrating tablet Take 1 tablet (4 mg total) by mouth every 8 (eight) hours as needed. 20 tablet 6   pantoprazole (PROTONIX) 40 MG tablet Take 1 tablet (40 mg total) by mouth daily. 90 tablet 0   rizatriptan (MAXALT-MLT) 10 MG disintegrating tablet Take 1 tablet (10 mg total) by mouth as needed. May repeat in 2 hours if needed 15 tablet 6   triamcinolone cream (KENALOG) 0.1 % Apply 1 application topically 2 (two) times daily as needed for itching.     Venlafaxine HCl 225 MG TB24 Take 225 mg by mouth daily.     Vitamin D, Ergocalciferol, (DRISDOL) 1.25 MG (50000 UT) CAPS capsule Take 50,000 Units by mouth every Wednesday.     No current facility-administered medications for this visit.   Allergies: Allergies  Allergen Reactions   Levofloxacin Other (See Comments)    Felt rotten, "felt like I was run over"   Percocet [Oxycodone-Acetaminophen] Palpitations    Skin crawling, tolerates hydrocodone    Sulfa Antibiotics Nausea And Vomiting and Rash   Social History: Social History   Socioeconomic History   Marital status: Married    Spouse name: Not on file   Number of children: 1   Years of education: college   Highest education level:  Associate degree: academic program  Occupational History   Occupation: Homemaker  Tobacco Use   Smoking status: Former    Packs/day: 0.50    Years: 4.00    Total pack years: 2.00    Types: Cigarettes    Quit date: 07/28/1986    Years since quitting: 35.0   Smokeless tobacco: Never  Vaping Use   Vaping Use: Never used  Substance and Sexual Activity   Alcohol use: No   Drug use: No   Sexual activity: Not on file  Other Topics Concern   Not on file  Social History Narrative   Lives at home with husband.   Right-handed.   2 cups coffee, 1 cup soda per day.   Social Determinants of Health   Financial Resource Strain: Not on file  Food Insecurity: Not on file  Transportation Needs: Not on file  Physical Activity: Not on file  Stress: Not  on file  Social Connections: Not on file   Lives in a single-family home that is 59 years old.  There are no roaches in the house and that is 2 feet off the floor.  There are dust mite precautions on bed and pillows.  She is exposed to fumes, chemicals or dust.  There is a HEPA filter in the home and home is not near an interstate industrial area. Smoking: No exposure Occupation: Not currently working  Environmental History: Immunologist in the house: no Engineer, civil (consulting) in the family room: no Carpet in the bedroom: no Heating: gas Cooling: central Pet: no  Family History: Family History  Problem Relation Age of Onset   Hypertension Mother    Other Father        unsure of medical history   Diabetes Sister    Prostate cancer Maternal Grandfather    Colon cancer Neg Hx    Esophageal cancer Neg Hx    Rectal cancer Neg Hx    Stomach cancer Neg Hx      ROS: All others negative except as noted per HPI.   Objective: BP 124/76   Pulse 85   Temp 98.7 F (37.1 C)   Resp 18   Ht 5\' 2"  (1.575 m)   Wt 191 lb 6 oz (86.8 kg)   LMP  (LMP Unknown)   SpO2 98%   BMI 35.00 kg/m  Body mass index is 35 kg/m.  General Appearance:  Alert, cooperative, no distress, appears stated age  Head:  Normocephalic, without obvious abnormality, atraumatic  Eyes:  Conjunctiva clear, EOM's intact  Nose: Nares normal, normal mucosa, no visible anterior polyps, and septum midline  Throat: Lips, tongue normal; teeth and gums normal, normal posterior oropharynx and no tonsillar exudate  Neck: Supple, symmetrical  Lungs:   clear to auscultation bilaterally, Respirations unlabored, no coughing  Heart:  regular rate and rhythm and no murmur, Appears well perfused  Extremities: No edema  Skin: Skin color, texture, turgor normal, no rashes or lesions on visualized portions of skin  Neurologic: No gross deficits   The plan was reviewed with the patient/family, and all questions/concerned were  addressed.  It was my pleasure to see Claudean today and participate in her care. Please feel free to contact me with any questions or concerns.  Sincerely,  Mandy Poag, MD Allergy & Immunology  [facility] office: [phone] [facility] office: [phone]

## 2021-09-02 LAB — CHRONIC URTICARIA: cu index: 10.8 — ABNORMAL HIGH (ref <10)

## 2021-09-02 LAB — ALPHA-GAL PANEL
Allergen Lamb IgE: <0.1 kU/L
Beef IgE: <0.1 kU/L
IgE (Immunoglobulin E), Serum: 138 [IU]/mL (ref 6–495)
O215-IgE Alpha-Gal: <0.1 kU/L
Pork IgE: <0.1 kU/L

## 2021-09-02 LAB — TRYPTASE: Tryptase: 4.6 ug/L (ref 2.2–13.2)

## 2021-09-02 LAB — H PYLORI, IGM, IGG, IGA AB
H pylori, IgM Abs: <9 units (ref 0.0–8.9)
H. pylori, IgA Abs: <9 units (ref 0.0–8.9)
H. pylori, IgG AbS: 0.12 Index Value (ref 0.00–0.79)

## 2021-09-02 NOTE — Progress Notes (Signed)
Labs returned all normal except for an elevated Chronic Hive Panel.  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  Will continue current treatment plan and have her follow up on 9/22 at 1:30 with me as planned. Can someone let patient know?  Thanks!

## 2021-09-11 ENCOUNTER — Ambulatory Visit: Payer: PRIVATE HEALTH INSURANCE | Admitting: Internal Medicine

## 2021-09-16 ENCOUNTER — Ambulatory Visit (INDEPENDENT_AMBULATORY_CARE_PROVIDER_SITE_OTHER): Payer: PRIVATE HEALTH INSURANCE | Admitting: Internal Medicine

## 2021-09-16 ENCOUNTER — Encounter: Payer: Self-pay | Admitting: Internal Medicine

## 2021-09-16 VITALS — BP 128/88 | HR 93 | Temp 98.6°F | Resp 18 | Wt 191.9 lb

## 2021-09-16 DIAGNOSIS — L4050 Arthropathic psoriasis, unspecified: Secondary | ICD-10-CM | POA: Diagnosis not present

## 2021-09-16 DIAGNOSIS — L503 Dermatographic urticaria: Secondary | ICD-10-CM

## 2021-09-16 DIAGNOSIS — L409 Psoriasis, unspecified: Secondary | ICD-10-CM | POA: Diagnosis not present

## 2021-09-16 MED ORDER — HALOBETASOL PROPIONATE 0.05 % EX OINT: TOPICAL_OINTMENT | Freq: Two times a day (BID) | CUTANEOUS | 1 refills | Status: AC

## 2021-09-16 NOTE — Patient Instructions (Addendum)
Chronic Idiopathic Pruritus:  Previous Testing:  negative to environmetals and common foods  - Labs: CU index positive   Therapy Plan:  - Start  Allegra 180mg  2 tabs in AM (fexofenadine)  - Continue Zyrtec 10mg  in PM  -Continue Singulair 10 mg PM  - if hives/itch are still uncontrolled with the above regimen, please arrange an appointment for discussion of Xolair (omalizumab)- an injectable medication for hives  Nonallergic Rhinitis: well  controlled  -Previous Testing: showed negative to grass, weeds, trees, molds, dust mite, cat, dog, mixed feathers, horse, roach, mouse, mixed tobacco leaf - Contiue taking:  Allegra 180mg  (2 tabs) in AM and zyrtec 10mg  at evening and Singulair (montelukast) 10mg  daily (this will help rhinitis and pruritus symptoms   - You can use an extra dose of the antihistamine, if needed, for breakthrough symptoms.  - Consider nasal saline rinses 1-2 times daily to remove allergens from the nasal cavities as well as help with mucous clearance (this is especially helpful to do before the nasal sprays are given)   Psoriasis:  - start halobetasol 0.5% ointment twice a day, if does not work follow up with dermatology   Follow up: 2 months   Thank you so much for letting me partake in your care today.  Don't hesitate to reach out if you have any additional concerns!  , MD  [facility] [place], [place]

## 2021-09-16 NOTE — Progress Notes (Signed)
Follow Up Note  RE: Mandy Grant MRN: [id number] DOB: 05/07/1960 Date of Office Visit: 09/16/2021  Referring provider: Hadley Pen, MD Primary care provider: Hadley Pen, MD  Chief Complaint: Follow-up (urt) and Urticaria (Follow up urticaria, doing well no change wants to discuss meds)  History of Present Illness: I had the pleasure of seeing Mandy Grant for a follow up visit at the [facility] on 09/16/2021. She is a 61 y.o. female, who is being followed for idioapthic pruritus. Her previous allergy office visit was on 08/21/2021 with Dr. Marlynn Perking. Today is a regular follow up visit.  History obtained from patient, chart review.  Urticaria  Did have sedation with zyrtec 20mg  twice daily, decreased to 10mg  twice daily and still has some chronic itch although is improved. Denies any frank urticaria.   She recently has an outbreak of psoriasis on left elbow.  She does not have any topical steroids or other topicals for treatment.  She does have had a history of psoriatic arthritis, but does not have a history of cutaneous psoriasis.    Assessment and Plan: Mandy Grant is a 61 y.o. female with: Dermatographic urticaria  Psoriasis  Psoriatic arthritis (HCC) Plan: Patient Instructions  Chronic Idiopathic Pruritus:  Previous Testing:  negative to environmetals and common foods  - Labs: CU index positive   Therapy Plan:  - Start  Allegra 180mg  2 tabs in AM (fexofenadine)  - Continue Zyrtec 10mg  in PM  -Continue Singulair 10 mg PM  - if hives/itch are still uncontrolled with the above regimen, please arrange an appointment for discussion of Xolair (omalizumab)- an injectable medication for hives  Nonallergic Rhinitis: well  controlled  -Previous Testing: showed negative to grass, weeds, trees, molds, dust mite, cat, dog, mixed feathers, horse, roach, mouse, mixed tobacco leaf - Contiue taking:  Allegra 180mg  (2 tabs) in AM and zyrtec 10mg  at evening and  Singulair (montelukast) 10mg  daily (this will help rhinitis and pruritus symptoms   - You can use an extra dose of the antihistamine, if needed, for breakthrough symptoms.  - Consider nasal saline rinses 1-2 times daily to remove allergens from the nasal cavities as well as help with mucous clearance (this is especially helpful to do before the nasal sprays are given)   Psoriasis:  - start halobetasol 0.5% ointment twice a day, if does not work follow up with dermatology   Follow up: 2 months   Thank you so much for letting me partake in your care today.  Don't hesitate to reach out if you have any additional concerns!  Donnamarie Poag, MD  [facility] [place] [place]  No follow-ups on file.  Meds ordered this encounter  Medications   halobetasol (ULTRAVATE) 0.05 % ointment    Sig: Apply topically 2 (two) times daily.    Dispense:  200 g    Refill:  1    Lab Orders  No laboratory test(s) ordered today   Diagnostics: None done    Medication List:  Current Outpatient Medications  Medication Sig Dispense Refill   ARTIFICIAL TEAR SOLUTION OP Place 1 drop into both eyes daily as needed (dry eyes).     cetirizine (ZYRTEC) 10 MG tablet TAKE 2 TABLETS BY MOUTH 2 TIMES DAILY. 90 tablet 1   diphenhydrAMINE (BENADRYL) 25 MG tablet Take 12.5 mg by mouth at bedtime as needed for allergies.     ezetimibe (ZETIA) 10 MG tablet Take 10 mg by mouth daily.  fluticasone (FLONASE) 50 MCG/ACT nasal spray Place 1 spray into both nostrils daily as needed for allergies.     gabapentin (NEURONTIN) 100 MG capsule TAKE 3 CAPSULES BY MOUTH AT BEDTIME. (Patient taking differently: Take 200 mg by mouth 3 (three) times daily.) 270 capsule 1   halobetasol (ULTRAVATE) 0.05 % ointment Apply topically 2 (two) times daily. 200 g 1   hydroxychloroquine (PLAQUENIL) 200 MG tablet Take 200 mg by mouth 2 (two) times daily.     hydrOXYzine (ATARAX) 25 MG tablet Take 12.5 mg by mouth at bedtime as  needed for itching.     linaclotide (LINZESS) 145 MCG CAPS capsule Take 145 mcg by mouth as needed (constipation).     montelukast (SINGULAIR) 10 MG tablet Take 1 tablet (10 mg total) by mouth at bedtime. 90 tablet 1   ondansetron (ZOFRAN ODT) 4 MG disintegrating tablet Take 1 tablet (4 mg total) by mouth every 8 (eight) hours as needed. 20 tablet 6   pantoprazole (PROTONIX) 40 MG tablet Take 1 tablet (40 mg total) by mouth daily. 90 tablet 0   rizatriptan (MAXALT-MLT) 10 MG disintegrating tablet Take 1 tablet (10 mg total) by mouth as needed. May repeat in 2 hours if needed 15 tablet 6   triamcinolone cream (KENALOG) 0.1 % Apply 1 application topically 2 (two) times daily as needed for itching.     Venlafaxine HCl 225 MG TB24 Take 225 mg by mouth daily.     Vitamin D, Ergocalciferol, (DRISDOL) 1.25 MG (50000 UT) CAPS capsule Take 50,000 Units by mouth every Wednesday.     No current facility-administered medications for this visit.   Allergies: Allergies  Allergen Reactions   Levofloxacin Other (See Comments)    Felt rotten, "felt like I was run over"   Percocet [Oxycodone-Acetaminophen] Palpitations    Skin crawling, tolerates hydrocodone    Sulfa Antibiotics Nausea And Vomiting and Rash   I reviewed her past medical history, social history, family history, and environmental history and no significant changes have been reported from her previous visit.  ROS: All others negative except as noted per HPI.   Objective: BP 128/88 (BP Location: Left Arm, Patient Position: Sitting, Cuff Size: Normal)   Pulse 93   Temp 98.6 F (37 C) (Temporal)   Resp 18   Wt 191 lb 14.4 oz (87 kg)   LMP  (LMP Unknown)   SpO2 97%   BMI 35.10 kg/m  Body mass index is 35.1 kg/m. General Appearance:  Alert, cooperative, no distress, appears stated age  Head:  Normocephalic, without obvious abnormality, atraumatic  Eyes:  Conjunctiva clear, EOM's intact  Nose: Nares normal,   Throat: Lips, tongue  normal; teeth and gums normal,   Neck: Supple, symmetrical  Lungs:   , Respirations unlabored, no coughing  Heart:  regular rate and rhythm and no murmur, Appears well perfused  Extremities: No edema  Skin: Skin color, texture, turgor normal, scaly plaque on left elbow   Neurologic: No gross deficits   Previous notes and tests were reviewed. The plan was reviewed with the patient/family, and all questions/concerned were addressed.  It was my pleasure to see Mandy Grant today and participate in her care. Please feel free to contact me with any questions or concerns.  Sincerely,  Ferol Luz, MD  Allergy & Immunology  [facility] Office: [phone]

## 2021-10-07 ENCOUNTER — Ambulatory Visit: Payer: Self-pay | Admitting: Student

## 2021-10-09 ENCOUNTER — Ambulatory Visit: Payer: PRIVATE HEALTH INSURANCE | Admitting: Internal Medicine

## 2021-10-31 ENCOUNTER — Other Ambulatory Visit: Payer: Self-pay | Admitting: Neurology

## 2021-11-15 ENCOUNTER — Other Ambulatory Visit: Payer: Self-pay | Admitting: Internal Medicine

## 2021-11-16 ENCOUNTER — Ambulatory Visit (INDEPENDENT_AMBULATORY_CARE_PROVIDER_SITE_OTHER): Payer: PRIVATE HEALTH INSURANCE | Admitting: Internal Medicine

## 2021-11-16 ENCOUNTER — Encounter: Payer: Self-pay | Admitting: Internal Medicine

## 2021-11-16 VITALS — BP 110/70 | HR 78 | Temp 98.4°F | Resp 20 | Wt 192.9 lb

## 2021-11-16 DIAGNOSIS — J310 Chronic rhinitis: Secondary | ICD-10-CM | POA: Diagnosis not present

## 2021-11-16 DIAGNOSIS — L409 Psoriasis, unspecified: Secondary | ICD-10-CM | POA: Diagnosis not present

## 2021-11-16 DIAGNOSIS — L4050 Arthropathic psoriasis, unspecified: Secondary | ICD-10-CM | POA: Diagnosis not present

## 2021-11-16 DIAGNOSIS — L501 Idiopathic urticaria: Secondary | ICD-10-CM | POA: Diagnosis not present

## 2021-11-16 MED ORDER — OMALIZUMAB 150 MG/ML ~~LOC~~ SOSY
150.0000 mg | PREFILLED_SYRINGE | Freq: Once | SUBCUTANEOUS | Status: AC
  Administered 2021-11-16: 150 mg via SUBCUTANEOUS

## 2021-11-16 MED ORDER — EPINEPHRINE 0.3 MG/0.3ML IJ SOAJ: 0.3000 mg | INTRAMUSCULAR | 1 refills | Status: AC | PRN

## 2021-11-16 NOTE — Progress Notes (Signed)
Follow Up Note  RE: Mandy Grant MRN: [id number] DOB: 1960/04/29 Date of Office Visit: 11/16/2021  Referring provider: Myrlene Broker, MD Primary care provider: Myrlene Broker, MD  Chief Complaint: Follow-up, Rash, and Urticaria  History of Present Illness: I had the pleasure of seeing Mandy Grant for a follow up visit at the [facility] [place] on 11/16/2021. She is a 61 y.o. female, who is being followed for idiopathic urticaria, nonallergic rhinitis. Her previous allergy office visit was on 09/16/2021 with Dr. Edison Pace. Today is a regular follow up visit.  History obtained from patient, chart review.  Urticaria  -Current treatment plan is Allegra 2 tabs every morning and Zyrtec 2 tabs every afternoon.  She still is having daily pruritus and dermatographia.  Recently increased stress due to mom who had a stroke.  She had to move into mom's house to help take care of her.  She is open to starting Xolair at this time.  Rhinitis Well-controlled on current antihistamine regimen.  No current complaints.  No side effects of medication.  Psoriasis She continues to have flares of psoriasis on her elbows which is responding to the halobetasol cream.  She has follow-up with rheumatology in December.  She previously has been on Humira and Skyrizi ever feels like is not helping.  Requests that allergy note be referred to her rheumatologist Dr. Amil Amen at [facility] rheumatology.  Assessment and Plan: Mandy Grant is a 61 y.o. female with: Idiopathic urticaria  Nonallergic rhinitis  Psoriatic arthritis ([place])  Psoriasis Plan: Patient Instructions  Chronic Idiopathic Pruritus:  Previous Testing:  negative to environmetals and common foods  - Labs: CU index positive   Therapy Plan:  - Continue Allegra 180mg  2 tabs in AM (fexofenadine)  - Continue Zyrtec 10mg  in PM  - Continue Singulair 10 mg PM  - Start Xolair 300 mg subcutaneous every 4 weeks, consent obtained today in  clinic and first dose given by sample today in clinic -We will reach out to our biologic coordinator  Tammy to coordinate approval for Xolair  Nonallergic Rhinitis: well  controlled  -Previous Testing: showed negative to grass, weeds, trees, molds, dust mite, cat, dog, mixed feathers, horse, roach, mouse, mixed tobacco leaf - Contiue taking:  Allegra 180mg  (2 tabs) in AM and zyrtec 10mg  at evening and Singulair (montelukast) 10mg  daily (this will help rhinitis and pruritus symptoms   - You can use an extra dose of the antihistamine, if needed, for breakthrough symptoms.  - Consider nasal saline rinses 1-2 times daily to remove allergens from the nasal cavities as well as help with mucous clearance (this is especially helpful to do before the nasal sprays are given)   Psoriasis:  -Continue halobetasol 0.5% ointment twice a day,  -Follow-up with rheumatology Dr. Leigh Aurora as planned  -We will forward this note to him for his awareness.   Follow up: 3 months   Thank you so much for letting me partake in your care today.  Don't hesitate to reach out if you have any additional concerns!  Roney Marion, MD  [facility] [place] [place] No follow-ups on file.  Meds ordered this encounter  Medications   omalizumab Arvid Right) prefilled syringe 150 mg   EPINEPHrine 0.3 mg/0.3 mL IJ SOAJ injection    Sig: Inject 0.3 mg into the muscle as needed for anaphylaxis.    Dispense:  1 each    Refill:  1    Lab Orders  No laboratory  test(s) ordered today   Diagnostics: None done    Medication List:  Current Outpatient Medications  Medication Sig Dispense Refill   ARTIFICIAL TEAR SOLUTION OP Place 1 drop into both eyes daily as needed (dry eyes).     cetirizine (ZYRTEC) 10 MG tablet TAKE 2 TABLETS BY MOUTH TWICE A DAY 90 tablet 1   diphenhydrAMINE (BENADRYL) 25 MG tablet Take 12.5 mg by mouth at bedtime as needed for allergies.     EPINEPHrine 0.3 mg/0.3 mL IJ SOAJ injection  Inject 0.3 mg into the muscle as needed for anaphylaxis. 1 each 1   ezetimibe (ZETIA) 10 MG tablet Take 10 mg by mouth daily.     fluticasone (FLONASE) 50 MCG/ACT nasal spray Place 1 spray into both nostrils daily as needed for allergies.     gabapentin (NEURONTIN) 100 MG capsule TAKE 3 CAPSULES BY MOUTH AT BEDTIME. (Patient taking differently: Take 200 mg by mouth 3 (three) times daily.) 270 capsule 1   halobetasol (ULTRAVATE) 0.05 % ointment Apply topically 2 (two) times daily. 200 g 1   hydroxychloroquine (PLAQUENIL) 200 MG tablet Take 200 mg by mouth 2 (two) times daily.     hydrOXYzine (ATARAX) 25 MG tablet Take 12.5 mg by mouth at bedtime as needed for itching.     linaclotide (LINZESS) 145 MCG CAPS capsule Take 145 mcg by mouth as needed (constipation).     montelukast (SINGULAIR) 10 MG tablet Take 1 tablet (10 mg total) by mouth at bedtime. 90 tablet 1   ondansetron (ZOFRAN ODT) 4 MG disintegrating tablet Take 1 tablet (4 mg total) by mouth every 8 (eight) hours as needed. 20 tablet 6   pantoprazole (PROTONIX) 40 MG tablet Take 1 tablet (40 mg total) by mouth daily. 90 tablet 0   rizatriptan (MAXALT-MLT) 10 MG disintegrating tablet Take 1 tablet (10 mg total) by mouth as needed. May repeat in 2 hours if needed 15 tablet 6   triamcinolone cream (KENALOG) 0.1 % Apply 1 application topically 2 (two) times daily as needed for itching.     Venlafaxine HCl 225 MG TB24 Take 225 mg by mouth daily.     Vitamin D, Ergocalciferol, (DRISDOL) 1.25 MG (50000 UT) CAPS capsule Take 50,000 Units by mouth every Wednesday.     No current facility-administered medications for this visit.   Allergies: Allergies  Allergen Reactions   Levofloxacin Other (See Comments)    Felt rotten, "felt like I was run over"   Percocet [Oxycodone-Acetaminophen] Palpitations    Skin crawling, tolerates hydrocodone    Sulfa Antibiotics Nausea And Vomiting and Rash   I reviewed her past medical history, social history,  family history, and environmental history and no significant changes have been reported from her previous visit.  ROS: All others negative except as noted per HPI.   Objective: BP 110/70 (BP Location: Left Arm, Patient Position: Sitting, Cuff Size: Normal)   Pulse 78   Temp 98.4 F (36.9 C) (Oral)   Resp 20   Wt 192 lb 14.4 oz (87.5 kg)   LMP  (LMP Unknown)   SpO2 97%   BMI 35.28 kg/m  Body mass index is 35.28 kg/m. General Appearance:  Alert, cooperative, no distress, appears stated age  Head:  Normocephalic, without obvious abnormality, atraumatic  Eyes:  Conjunctiva clear, EOM's intact  Nose: Nares normal,   Throat: Lips, tongue normal; teeth and gums normal,   Neck: Supple, symmetrical  Lungs:   , Respirations unlabored, no coughing  Heart:  regular  rate and rhythm and no murmur, Appears well perfused  Extremities: No edema  Skin: Skin color, texture, turgor normal, no rashes or lesions on visualized portions of skin   Neurologic: No gross deficits   Previous notes and tests were reviewed. The plan was reviewed with the patient/family, and all questions/concerned were addressed.  It was my pleasure to see Kamisha today and participate in her care. Please feel free to contact me with any questions or concerns.  Sincerely,  Ferol Luz, MD  Allergy & Immunology  [facility] Office: [phone]

## 2021-11-16 NOTE — Patient Instructions (Addendum)
Chronic Idiopathic Pruritus:  Previous Testing:  negative to environmetals and common foods  - Labs: CU index positive   Therapy Plan:  - Continue Allegra 180mg  2 tabs in AM (fexofenadine)  - Continue Zyrtec 10mg  in PM  - Continue Singulair 10 mg PM  - Start Xolair 300 mg subcutaneous every 4 weeks, consent obtained today in clinic and first dose given by sample today in clinic -We will reach out to our biologic coordinator  Tammy to coordinate approval for Xolair  Nonallergic Rhinitis: well  controlled  -Previous Testing: showed negative to grass, weeds, trees, molds, dust mite, cat, dog, mixed feathers, horse, roach, mouse, mixed tobacco leaf - Contiue taking:  Allegra 180mg  (2 tabs) in AM and zyrtec 10mg  at evening and Singulair (montelukast) 10mg  daily (this will help rhinitis and pruritus symptoms   - You can use an extra dose of the antihistamine, if needed, for breakthrough symptoms.  - Consider nasal saline rinses 1-2 times daily to remove allergens from the nasal cavities as well as help with mucous clearance (this is especially helpful to do before the nasal sprays are given)   Psoriasis:  -Continue halobetasol 0.5% ointment twice a day,  -Follow-up with rheumatology Dr. Leigh Aurora as planned  -We will forward this note to him for his awareness.   Follow up: 3 months   Thank you so much for letting me partake in your care today.  Don't hesitate to reach out if you have any additional concerns!  Roney Marion, MD  [facility] [place] [place]

## 2021-11-18 ENCOUNTER — Telehealth: Payer: Self-pay | Admitting: *Deleted

## 2021-11-18 NOTE — Telephone Encounter (Signed)
Called patient and advised approval, copay card and submit to [facility] for [place]. Already has appt in [place] for next dose

## 2021-11-19 ENCOUNTER — Telehealth: Payer: Self-pay | Admitting: Neurology

## 2021-11-19 MED ORDER — RIZATRIPTAN BENZOATE 10 MG PO TBDP: 10.0000 mg | ORAL_TABLET | ORAL | 6 refills | Status: AC | PRN

## 2021-11-19 NOTE — Telephone Encounter (Signed)
Meds ordered this encounter  Medications   rizatriptan (MAXALT-MLT) 10 MG disintegrating tablet    Sig: Take 1 tablet (10 mg total) by mouth as needed. May repeat in 2 hours if needed    Dispense:  15 tablet    Refill:  6

## 2021-11-23 NOTE — Telephone Encounter (Signed)
Thanks

## 2021-12-14 ENCOUNTER — Ambulatory Visit: Payer: PRIVATE HEALTH INSURANCE

## 2021-12-14 ENCOUNTER — Ambulatory Visit (INDEPENDENT_AMBULATORY_CARE_PROVIDER_SITE_OTHER): Payer: PRIVATE HEALTH INSURANCE | Admitting: *Deleted

## 2021-12-14 DIAGNOSIS — L501 Idiopathic urticaria: Secondary | ICD-10-CM | POA: Diagnosis not present

## 2021-12-14 MED ORDER — OMALIZUMAB 150 MG/ML ~~LOC~~ SOSY
300.0000 mg | PREFILLED_SYRINGE | SUBCUTANEOUS | Status: AC
  Administered 2021-12-14 – 2024-02-21 (×29): 300 mg via SUBCUTANEOUS

## 2022-01-14 ENCOUNTER — Ambulatory Visit (INDEPENDENT_AMBULATORY_CARE_PROVIDER_SITE_OTHER): Payer: PRIVATE HEALTH INSURANCE | Admitting: *Deleted

## 2022-01-14 DIAGNOSIS — L501 Idiopathic urticaria: Secondary | ICD-10-CM | POA: Diagnosis not present

## 2022-02-11 ENCOUNTER — Ambulatory Visit (INDEPENDENT_AMBULATORY_CARE_PROVIDER_SITE_OTHER): Payer: PRIVATE HEALTH INSURANCE | Admitting: *Deleted

## 2022-02-11 DIAGNOSIS — L501 Idiopathic urticaria: Secondary | ICD-10-CM | POA: Diagnosis not present

## 2022-03-11 ENCOUNTER — Ambulatory Visit (INDEPENDENT_AMBULATORY_CARE_PROVIDER_SITE_OTHER): Payer: PRIVATE HEALTH INSURANCE | Admitting: *Deleted

## 2022-03-11 DIAGNOSIS — L501 Idiopathic urticaria: Secondary | ICD-10-CM

## 2022-04-08 ENCOUNTER — Ambulatory Visit (INDEPENDENT_AMBULATORY_CARE_PROVIDER_SITE_OTHER): Payer: PRIVATE HEALTH INSURANCE

## 2022-04-08 DIAGNOSIS — L501 Idiopathic urticaria: Secondary | ICD-10-CM

## 2022-04-21 ENCOUNTER — Other Ambulatory Visit: Payer: Self-pay

## 2022-04-21 ENCOUNTER — Ambulatory Visit (INDEPENDENT_AMBULATORY_CARE_PROVIDER_SITE_OTHER): Payer: PRIVATE HEALTH INSURANCE | Admitting: Internal Medicine

## 2022-04-21 ENCOUNTER — Encounter: Payer: Self-pay | Admitting: Internal Medicine

## 2022-04-21 VITALS — BP 128/76 | HR 76 | Temp 98.0°F | Resp 18 | Wt 189.0 lb

## 2022-04-21 DIAGNOSIS — L4050 Arthropathic psoriasis, unspecified: Secondary | ICD-10-CM | POA: Diagnosis not present

## 2022-04-21 DIAGNOSIS — L409 Psoriasis, unspecified: Secondary | ICD-10-CM | POA: Diagnosis not present

## 2022-04-21 DIAGNOSIS — L501 Idiopathic urticaria: Secondary | ICD-10-CM

## 2022-04-21 DIAGNOSIS — J310 Chronic rhinitis: Secondary | ICD-10-CM | POA: Diagnosis not present

## 2022-04-21 DIAGNOSIS — K5909 Other constipation: Secondary | ICD-10-CM

## 2022-04-21 NOTE — Progress Notes (Signed)
Follow Up Note  RE: Mandy Grant MRN: [id number] DOB: Apr 19, 1960 Date of Office Visit: 04/21/2022  Referring provider: Myrlene Broker, MD Primary care provider: Myrlene Broker, MD  Chief Complaint: Follow-up (Doing good on xolair)  History of Present Illness: I had the pleasure of seeing Mandy Grant for a follow up visit at the [facility] [place] [facility] [place] on 04/21/2022. She is a 62 y.o. female, who is being followed for idiopathic urticaria, nonallergic rhinitis. Her previous allergy office visit was on 11/16/21 with Dr. Edison Pace. Today is a regular follow up visit.  History obtained from patient, chart review.  Urticaria  -Current treatment plan is Xolair 300 mg every 4 weeks.  She has noticed a decrease in pruritus and dermatographism. She has noticed red itchy bumps that are persistent in her armpits when she applies deodorant.  She has not had patch testing.  Otherwise no frank urticaria.  Rhinitis Increase in nasal congestion, rhinorrhea over the past few weeks.  Is not taking her antihistamines.  Psoriasis She continues to have flares of psoriasis on her elbows which is responding to the halobetasol cream.  She has follow-up with rheumatology in December.  She previously has been on Humira, [employer] and now on [employer]. She does not feel like anything helps. She follows with Dr. Amil Amen at [place].   She also reports a history of IBS-C however used to be controlled on Linzess.  Currently requiring up to 3 Linzess a day and still not having a bowel movement.  Is compliant with hydration.  Is also having left lower quadrant pain.  Previously followed with [facility] for chronic gastritis.  Has not been seen in about a year.  Last colonoscopy at age 55.  Also complaining of new onset fatigue  Assessment and Plan: Shadava is a 62 y.o. female with: Idiopathic urticaria  Nonallergic rhinitis  Psoriatic arthritis  Psoriasis  Chronic  constipation Plan: Patient Instructions  Chronic Idiopathic Pruritus:  Previous Testing:  negative to environmetals and common foods  - Labs: CU index positive   Therapy Plan:  - RESTART Allegra 180mg  1 tab in AM, can add addition in PM if needed  - Continue Xolair 300 mg subcutaneous every 4 weeks,   Nonallergic Rhinitis: well  controlled  -Previous Testing: showed negative to grass, weeds, trees, molds, dust mite, cat, dog, mixed feathers, horse, roach, mouse, mixed tobacco leaf - Continue taking:  Allegra 180mg  daily    Singulair 10mg  nightly  - You can use an extra dose of the antihistamine, if needed, for breakthrough symptoms.  - Consider nasal saline rinses 1-2 times daily to remove allergens from the nasal cavities as well as help with mucous clearance (this is especially helpful to do before the nasal sprays are given)   Psoriasis:  -Continue halobetasol 0.5% ointment twice a day,  -Follow-up with rheumatology Dr. Leigh Aurora as planned    Contact dermatitis  - We can do patch testing once GI symptoms are resolved  Patches are best placed on Monday with return to office on Wednesday and Friday of same week for readings.  Patches once placed should not get wet.  You do not have to stop any medications for patch testing but should not be on oral prednisone. You can schedule a patch testing visit when convenient for your schedule.     GO GET A COLONOSCOPY   - Recommend Dr. Dustin Flock at [facility]   Follow up: 6 months   Thank  you so much for letting me partake in your care today.  Don't hesitate to reach out if you have any additional concerns!  Roney Marion, MD  [facility] [place] [place] No follow-ups on file.  No orders of the defined types were placed in this encounter.   Lab Orders  No laboratory test(s) ordered today   Diagnostics: None done    Medication List:  Current Outpatient Medications  Medication Sig Dispense Refill    ARTIFICIAL TEAR SOLUTION OP Place 1 drop into both eyes daily as needed (dry eyes).     cetirizine (ZYRTEC) 10 MG tablet TAKE 2 TABLETS BY MOUTH TWICE A DAY 90 tablet 1   diphenhydrAMINE (BENADRYL) 25 MG tablet Take 12.5 mg by mouth at bedtime as needed for allergies.     EPINEPHrine 0.3 mg/0.3 mL IJ SOAJ injection Inject 0.3 mg into the muscle as needed for anaphylaxis. 1 each 1   ezetimibe (ZETIA) 10 MG tablet Take 10 mg by mouth daily.     fluticasone (FLONASE) 50 MCG/ACT nasal spray Place 1 spray into both nostrils daily as needed for allergies.     gabapentin (NEURONTIN) 100 MG capsule TAKE 3 CAPSULES BY MOUTH AT BEDTIME. (Patient taking differently: Take 200 mg by mouth 3 (three) times daily.) 270 capsule 1   halobetasol (ULTRAVATE) 0.05 % ointment Apply topically 2 (two) times daily. 200 g 1   hydroxychloroquine (PLAQUENIL) 200 MG tablet Take 200 mg by mouth 2 (two) times daily.     hydrOXYzine (ATARAX) 25 MG tablet Take 12.5 mg by mouth at bedtime as needed for itching.     linaclotide (LINZESS) 145 MCG CAPS capsule Take 145 mcg by mouth as needed (constipation).     montelukast (SINGULAIR) 10 MG tablet Take 1 tablet (10 mg total) by mouth at bedtime. 90 tablet 1   ondansetron (ZOFRAN ODT) 4 MG disintegrating tablet Take 1 tablet (4 mg total) by mouth every 8 (eight) hours as needed. 20 tablet 6   pantoprazole (PROTONIX) 40 MG tablet Take 1 tablet (40 mg total) by mouth daily. 90 tablet 0   rizatriptan (MAXALT-MLT) 10 MG disintegrating tablet Take 1 tablet (10 mg total) by mouth as needed. May repeat in 2 hours if needed 15 tablet 6   triamcinolone cream (KENALOG) 0.1 % Apply 1 application topically 2 (two) times daily as needed for itching.     Venlafaxine HCl 225 MG TB24 Take 225 mg by mouth daily.     Vitamin D, Ergocalciferol, (DRISDOL) 1.25 MG (50000 UT) CAPS capsule Take 50,000 Units by mouth every Wednesday.     Current Facility-Administered Medications  Medication Dose Route  Frequency Provider Last Rate Last Admin   omalizumab Arvid Right) prefilled syringe 300 mg  300 mg Subcutaneous Q28 days Jiles Prows, MD   300 mg at 04/08/22 1337   Allergies: Allergies  Allergen Reactions   Levofloxacin Other (See Comments)    Felt rotten, "felt like I was run over"   Percocet [Oxycodone-Acetaminophen] Palpitations    Skin crawling, tolerates hydrocodone    Sulfa Antibiotics Nausea And Vomiting and Rash   I reviewed her past medical history, social history, family history, and environmental history and no significant changes have been reported from her previous visit.  ROS: All others negative except as noted per HPI.   Objective: BP 128/76 (BP Location: Left Arm, Patient Position: Sitting, Cuff Size: Normal)   Pulse 76   Temp 98 F (36.7 C) (Temporal)   Resp  18   Wt 189 lb (85.7 kg)   LMP  (LMP Unknown)   SpO2 98%   BMI 34.57 kg/m  Body mass index is 34.57 kg/m. General Appearance:  Alert, cooperative, no distress, appears stated age  Head:  Normocephalic, without obvious abnormality, atraumatic  Eyes:  Conjunctiva clear, EOM's intact  Nose: Nares normal,   Throat: Lips, tongue normal; teeth and gums normal,   Neck: Supple, symmetrical  Lungs:   , Respirations unlabored, no coughing  Heart:    , Appears well perfused  Extremities: No edema  Skin: Skin color, texture, turgor normal, no rashes or lesions on visualized portions of skin   Neurologic: No gross deficits   Previous notes and tests were reviewed. The plan was reviewed with the patient/family, and all questions/concerned were addressed.  It was my pleasure to see Mandy Grant today and participate in her care. Please feel free to contact me with any questions or concerns.  Sincerely,  Roney Marion, MD  Allergy & Immunology  [facility] [place] [facility] Office: [phone]

## 2022-04-21 NOTE — Patient Instructions (Addendum)
Chronic Idiopathic Pruritus:  Previous Testing:  negative to environmetals and common foods  - Labs: CU index positive   Therapy Plan:  - RESTART Allegra 180mg  1 tab in AM, can add addition in PM if needed  - Continue Xolair 300 mg subcutaneous every 4 weeks,   Nonallergic Rhinitis: well  controlled  -Previous Testing: showed negative to grass, weeds, trees, molds, dust mite, cat, dog, mixed feathers, horse, roach, mouse, mixed tobacco leaf - Continue taking:  Allegra 180mg  daily    Singulair 10mg  nightly  - You can use an extra dose of the antihistamine, if needed, for breakthrough symptoms.  - Consider nasal saline rinses 1-2 times daily to remove allergens from the nasal cavities as well as help with mucous clearance (this is especially helpful to do before the nasal sprays are given)   Psoriasis:  -Continue halobetasol 0.5% ointment twice a day,  -Follow-up with rheumatology Dr. Leigh Aurora as planned    Contact dermatitis  - We can do patch testing once GI symptoms are resolved  Patches are best placed on Monday with return to office on Wednesday and Friday of same week for readings.  Patches once placed should not get wet.  You do not have to stop any medications for patch testing but should not be on oral prednisone. You can schedule a patch testing visit when convenient for your schedule.     GO GET A COLONOSCOPY   - Recommend Dr. Dustin Flock at [facility]   Follow up: 6 months   Thank you so much for letting me partake in your care today.  Don't hesitate to reach out if you have any additional concerns!  Roney Marion, MD  [facility], [place]

## 2022-05-06 ENCOUNTER — Ambulatory Visit (INDEPENDENT_AMBULATORY_CARE_PROVIDER_SITE_OTHER): Payer: PRIVATE HEALTH INSURANCE | Admitting: *Deleted

## 2022-05-06 DIAGNOSIS — L501 Idiopathic urticaria: Secondary | ICD-10-CM | POA: Diagnosis not present

## 2022-06-03 ENCOUNTER — Ambulatory Visit (INDEPENDENT_AMBULATORY_CARE_PROVIDER_SITE_OTHER): Payer: PRIVATE HEALTH INSURANCE | Admitting: *Deleted

## 2022-06-03 DIAGNOSIS — L501 Idiopathic urticaria: Secondary | ICD-10-CM | POA: Diagnosis not present

## 2022-07-01 ENCOUNTER — Ambulatory Visit (INDEPENDENT_AMBULATORY_CARE_PROVIDER_SITE_OTHER): Payer: PRIVATE HEALTH INSURANCE | Admitting: *Deleted

## 2022-07-01 DIAGNOSIS — L501 Idiopathic urticaria: Secondary | ICD-10-CM

## 2022-07-29 ENCOUNTER — Ambulatory Visit (INDEPENDENT_AMBULATORY_CARE_PROVIDER_SITE_OTHER): Payer: PRIVATE HEALTH INSURANCE | Admitting: *Deleted

## 2022-07-29 DIAGNOSIS — L501 Idiopathic urticaria: Secondary | ICD-10-CM

## 2022-08-26 ENCOUNTER — Ambulatory Visit: Payer: PRIVATE HEALTH INSURANCE

## 2022-08-30 ENCOUNTER — Ambulatory Visit (INDEPENDENT_AMBULATORY_CARE_PROVIDER_SITE_OTHER): Payer: PRIVATE HEALTH INSURANCE | Admitting: *Deleted

## 2022-08-30 DIAGNOSIS — L501 Idiopathic urticaria: Secondary | ICD-10-CM

## 2022-09-27 ENCOUNTER — Ambulatory Visit (INDEPENDENT_AMBULATORY_CARE_PROVIDER_SITE_OTHER): Payer: PRIVATE HEALTH INSURANCE | Admitting: *Deleted

## 2022-09-27 DIAGNOSIS — L501 Idiopathic urticaria: Secondary | ICD-10-CM | POA: Diagnosis not present

## 2022-10-25 ENCOUNTER — Encounter: Payer: Self-pay | Admitting: Internal Medicine

## 2022-10-25 ENCOUNTER — Ambulatory Visit (INDEPENDENT_AMBULATORY_CARE_PROVIDER_SITE_OTHER): Payer: PRIVATE HEALTH INSURANCE | Admitting: Internal Medicine

## 2022-10-25 VITALS — BP 120/58 | HR 91 | Temp 98.1°F | Resp 18

## 2022-10-25 DIAGNOSIS — L501 Idiopathic urticaria: Secondary | ICD-10-CM

## 2022-10-25 DIAGNOSIS — L409 Psoriasis, unspecified: Secondary | ICD-10-CM | POA: Diagnosis not present

## 2022-10-25 DIAGNOSIS — J310 Chronic rhinitis: Secondary | ICD-10-CM | POA: Diagnosis not present

## 2022-10-25 DIAGNOSIS — L2389 Allergic contact dermatitis due to other agents: Secondary | ICD-10-CM | POA: Diagnosis not present

## 2022-10-25 NOTE — Progress Notes (Unsigned)
Follow Up Note  RE: Mandy Grant MRN: [id number] DOB: 10-Jan-1961 Date of Office Visit: 10/25/2022  Referring provider: Hadley Pen, MD Primary care provider: Hadley Pen, MD  Chief Complaint: Follow-up (Pt states that while on her trip to [place] while in the sun, she breaks out into these spot spot/rash on her legs.  Also dental oral rinse and numbing meds her itch, possible lavender allergy.) and Urticaria  History of Present Illness: I had the pleasure of seeing Mandy Grant for a follow up visit at the [facility] on 10/27/2022. She is a 62 y.o. female, who is being followed for idiopathic urticaria, nonallergic rhinitis. Her previous allergy office visit was on 04/20/22  with Dr. Marlynn Perking. Today is a regular follow up visit.  History obtained from patient, chart review.  Urticaria  -Current treatment plan is Xolair 300 mg every 4 weeks.  Taking Allegra 180 mg in the a.m. no frank urticaria.  Previously complained of red itchy bumps that are persistent in her armpits when she applies deodorant.  On a recent trip to [place], she had similar rash on bilateral legs.  Only on sun exposed parts.  She recently broke her leg and her left leg was in a cast.  She has not had patch testing, but is interested   She had significant dental work done this summer and noted every time she got topical anesthesia she had pruritus of her face.  She does not know what exact topical anesthesia they used but can look this up.  Rhinitis Well-controlled.  Denies any breakthrough nasal or ocular symptoms.  Psoriasis Interim mitten flares on her elbows which responds to  halobetasol cream.    She previously has been on Humira, [employer] and now on [employer], broadcasting/film/video. S She follows with Dr. Dierdre Forth at [facility].   Previous GI complaints have since resolved.  She did not follow-up with GI.  No longer has any issues  Assessment and Plan: [name] is a 62 y.o. female with: Idiopathic  urticaria  Allergic contact dermatitis due to other agents - Plan: Patch Test  Nonallergic rhinitis  Psoriasis Plan: Patient Instructions  Chronic Idiopathic Pruritus:  Previous Testing:  negative to environmetals and common foods  - Labs: CU index positive   Therapy Plan:  - Continue Allegra 180mg  1 tab in AM, can add addition in PM if needed  - Continue Xolair 300 mg subcutaneous every 4 weeks,   Nonallergic Rhinitis: well  controlled  -Previous Testing: showed negative to grass, weeds, trees, molds, dust mite, cat, dog, mixed feathers, horse, roach, mouse, mixed tobacco leaf - Continue taking:  Allegra 180mg  daily    Singulair 10mg  nightly  - You can use an extra dose of the antihistamine, if needed, for breakthrough symptoms.  - Consider nasal saline rinses 1-2 times daily to remove allergens from the nasal cavities as well as help with mucous clearance (this is especially helpful to do before the nasal sprays are given)   Psoriasis:  -Continue halobetasol 0.5% ointment twice a day,  -Follow-up with rheumatology Dr. Alben Deeds as planned    Contact dermatitis  - We can do patch testing once GI symptoms are resolved  Patches are best placed on Monday with return to office on Wednesday and Friday of same week for readings.  Patches once placed should not get wet.  You do not have to stop any medications for patch testing but should not be on oral prednisone. You can schedule  a patch testing visit when convenient for your schedule.    Follow up: 6 months   Thank you so much for letting me partake in your care today.  Don't hesitate to reach out if you have any additional concerns!  Ferol Luz, MD  [facility] [place], [place] No follow-ups on file.  No orders of the defined types were placed in this encounter.   Lab Orders  No laboratory test(s) ordered today   Diagnostics: None done    Medication List:  Current Outpatient Medications   Medication Sig Dispense Refill   albuterol (VENTOLIN HFA) 108 (90 Base) MCG/ACT inhaler Inhale into the lungs.     Apremilast (OTEZLA) 30 MG TABS 1 tablet Orally Twice a day for 30 days     ARTIFICIAL TEAR SOLUTION OP Place 1 drop into both eyes daily as needed (dry eyes).     cetirizine (ZYRTEC) 10 MG tablet TAKE 2 TABLETS BY MOUTH TWICE A DAY 90 tablet 1   diphenhydrAMINE (BENADRYL) 25 MG tablet Take 12.5 mg by mouth at bedtime as needed for allergies.     EPINEPHrine 0.3 mg/0.3 mL IJ SOAJ injection Inject 0.3 mg into the muscle as needed for anaphylaxis. 1 each 1   ezetimibe (ZETIA) 10 MG tablet Take 10 mg by mouth daily.     famotidine (PEPCID) 40 MG tablet Take 40 mg by mouth daily.     fluticasone (FLONASE) 50 MCG/ACT nasal spray Place 1 spray into both nostrils daily as needed for allergies.     gabapentin (NEURONTIN) 100 MG capsule TAKE 3 CAPSULES BY MOUTH AT BEDTIME. (Patient taking differently: Take 200 mg by mouth 3 (three) times daily.) 270 capsule 1   halobetasol (ULTRAVATE) 0.05 % ointment Apply topically 2 (two) times daily. 200 g 1   hydroxychloroquine (PLAQUENIL) 200 MG tablet Take 200 mg by mouth 2 (two) times daily.     hydrOXYzine (ATARAX) 25 MG tablet Take 12.5 mg by mouth at bedtime as needed for itching.     linaclotide (LINZESS) 145 MCG CAPS capsule Take 145 mcg by mouth as needed (constipation).     LORazepam (ATIVAN) 1 MG tablet TAKE 1-2 TABLETS BY MOUTH NIGHT BEFORE APPOINTMENT THEN 1-2 TABLETS 30 MINUTES PRIOR TO APPOINTMENT     ondansetron (ZOFRAN ODT) 4 MG disintegrating tablet Take 1 tablet (4 mg total) by mouth every 8 (eight) hours as needed. 20 tablet 6   pantoprazole (PROTONIX) 40 MG tablet Take 1 tablet (40 mg total) by mouth daily. 90 tablet 0   rizatriptan (MAXALT-MLT) 10 MG disintegrating tablet Take 1 tablet (10 mg total) by mouth as needed. May repeat in 2 hours if needed 15 tablet 6   triamcinolone cream (KENALOG) 0.1 % Apply 1 application topically 2  (two) times daily as needed for itching.     Venlafaxine HCl 225 MG TB24 Take 225 mg by mouth daily.     Vitamin D, Ergocalciferol, (DRISDOL) 1.25 MG (50000 UT) CAPS capsule Take 50,000 Units by mouth every Wednesday.     montelukast (SINGULAIR) 10 MG tablet TAKE 1 TABLET BY MOUTH EVERYDAY AT BEDTIME 90 tablet 1   Current Facility-Administered Medications  Medication Dose Route Frequency Provider Last Rate Last Admin   omalizumab Geoffry Paradise) prefilled syringe 300 mg  300 mg Subcutaneous Q28 days Jessica Priest, MD   300 mg at 10/26/22 1124   Allergies: Allergies  Allergen Reactions   Levofloxacin Other (See Comments)    Felt rotten, "felt like I  was run over"   Percocet [Oxycodone-Acetaminophen] Palpitations    Skin crawling, tolerates hydrocodone    Sulfa Antibiotics Nausea And Vomiting and Rash   I reviewed her past medical history, social history, family history, and environmental history and no significant changes have been reported from her previous visit.  ROS: All others negative except as noted per HPI.   Objective: BP (!) 120/58   Pulse 91   Temp 98.1 F (36.7 C) (Temporal)   Resp 18   LMP  (LMP Unknown)   SpO2 97%  There is no height or weight on file to calculate BMI. General Appearance:  Alert, cooperative, no distress, appears stated age  Head:  Normocephalic, without obvious abnormality, atraumatic  Eyes:  Conjunctiva clear, EOM's intact  Nose: Nares normal,   Throat: Lips, tongue normal; teeth and gums normal,   Neck: Supple, symmetrical  Lungs:   , Respirations unlabored, no coughing  Heart:    , Appears well perfused  Extremities: No edema  Skin: Skin color, texture, turgor normal, eczematous patches healing on bilateral shins   Neurologic: No gross deficits   Previous notes and tests were reviewed. The plan was reviewed with the patient/family, and all questions/concerned were addressed.  It was my pleasure to see Levi today and participate in her care.  Please feel free to contact me with any questions or concerns.  Sincerely,  Ferol Luz, MD  Allergy & Immunology  [facility] Office: [phone]

## 2022-10-25 NOTE — Patient Instructions (Signed)
Chronic Idiopathic Pruritus:  Previous Testing:  negative to environmetals and common foods  - Labs: CU index positive   Therapy Plan:  - RESTART Allegra 180mg  1 tab in AM, can add addition in PM if needed  - Continue Xolair 300 mg subcutaneous every 4 weeks,   Nonallergic Rhinitis: well  controlled  -Previous Testing: showed negative to grass, weeds, trees, molds, dust mite, cat, dog, mixed feathers, horse, roach, mouse, mixed tobacco leaf - Continue taking:  Allegra 180mg  daily    Singulair 10mg  nightly  - You can use an extra dose of the antihistamine, if needed, for breakthrough symptoms.  - Consider nasal saline rinses 1-2 times daily to remove allergens from the nasal cavities as well as help with mucous clearance (this is especially helpful to do before the nasal sprays are given)   Psoriasis:  -Continue halobetasol 0.5% ointment twice a day,  -Follow-up with rheumatology Dr. Alben Deeds as planned    Contact dermatitis  - We can do patch testing once GI symptoms are resolved  Patches are best placed on Monday with return to office on Wednesday and Friday of same week for readings.  Patches once placed should not get wet.  You do not have to stop any medications for patch testing but should not be on oral prednisone. You can schedule a patch testing visit when convenient for your schedule.     GO GET A COLONOSCOPY   - Recommend Dr. Tiajuana Amass at [facility]   Follow up: 6 months   Thank you so much for letting me partake in your care today.  Don't hesitate to reach out if you have any additional concerns!  Ferol Luz, MD  [facility] [place], [place]

## 2022-10-26 ENCOUNTER — Other Ambulatory Visit: Payer: Self-pay | Admitting: Internal Medicine

## 2022-10-26 ENCOUNTER — Ambulatory Visit: Payer: PRIVATE HEALTH INSURANCE

## 2022-10-26 DIAGNOSIS — L501 Idiopathic urticaria: Secondary | ICD-10-CM | POA: Diagnosis not present

## 2022-10-27 ENCOUNTER — Ambulatory Visit (INDEPENDENT_AMBULATORY_CARE_PROVIDER_SITE_OTHER): Payer: PRIVATE HEALTH INSURANCE | Admitting: Internal Medicine

## 2022-10-27 DIAGNOSIS — L2389 Allergic contact dermatitis due to other agents: Secondary | ICD-10-CM | POA: Diagnosis not present

## 2022-10-27 NOTE — Progress Notes (Signed)
   Follow Up Note  RE: Mandy Grant MRN: [id number] DOB: 1960/12/27 Date of Office Visit: 10/27/2022  Referring provider: Hadley Pen, MD Primary care provider: Hadley Pen, MD  History of Present Illness: I had the pleasure of seeing Mandy Grant for a follow up visit at the [facility] on 10/27/2022. She is a 62 y.o. female, who is being followed for contact . Today she is here for initial patch test interpretation, given suspected history of contact dermatitis.   Diagnostics:  TRUE TEST 48 hour reading: equivocal to hydrocortisone   T.R.U.E. Test - 10/27/22 1300       Panel 1   1. Nickel Sulfate 0    2. Wool Alcohols 0    3. Neomycin Sulfate 0    4. Potassium Dichromate 0    5. Caine Mix 0    6. Fragrance Mix 0    7. Colophony 0    8. Paraben Mix 0    9. Negative Control 0    10. Balsam of [place] 0    11. Ethylenediamine Dihydrochloride 0    12. Cobalt Dichloride 0      Panel 2   13. p-tert Butylphenol Formaldehyde Resin 0    14. Epoxy Resin 0    15. Carba Mix 0    16.  Black Rubber Mix 0    17. Cl+ Me-Isothiazolinone 0    18. Quaternium-15 0    19. Methyldibromo Glutaronitrile 0    20. p-Phenylenediamine 0    21. Formaldehyde 0    22. Mercapto Mix 0    23. Thimerosal 0    24. Thiuram Mix 0      Panel 3   25. Diazolidinyl Urea 0    26. Quinoline Mix 0    27. Tixocortol-21-Pivalate 0    28. Gold Sodium Thiosulfate 0    29. Imidazolidinyl Urea 0    30. Budesonide 0    31. Hydrocortisone-17-Butyrate 0   ?   32. Mercaptobenzothiazole 0    34. Parthenolide 0    35. Disperse Blue 106 0    36. 2-Bromo-2-Nitropropane-1,3-diol 0      Comments   Comments n              Assessment and Plan: Mandy Grant is a 62 y.o. female with: Concern for Contact Dermatitis:  The patient has been provided detailed information regarding the substances she is sensitive to, as well as products containing the substances.  Meticulous avoidance of  these substances is recommended. If avoidance is not possible, the use of barrier creams or lotions is recommended. If symptoms persist or progress despite meticulous avoidance of chemicals/substances above, dermatology evaluation may be warranted. No follow-ups on file.  It was my pleasure to see Mandy Grant today and participate in her care. Please feel free to contact me with any questions or concerns.  Sincerely,   Ferol Luz, MD [facility]

## 2022-10-29 ENCOUNTER — Ambulatory Visit: Payer: PRIVATE HEALTH INSURANCE | Admitting: Internal Medicine

## 2022-10-29 DIAGNOSIS — L2389 Allergic contact dermatitis due to other agents: Secondary | ICD-10-CM

## 2022-10-29 NOTE — Progress Notes (Signed)
   Follow Up Note  RE: Mandy Grant MRN: [id number] DOB: [birth year] Date of Office Grant: 10/29/2022  Referring provider: Hadley Pen, MD Primary care provider: Hadley Pen, MD  History of Present Illness: I had the pleasure of seeing Mandy Grant at the [facility] on 10/29/2022. She is a 62 y.o. female, who is being followed for contact dermatitis . Today she is here for final patch test interpretation, given suspected history of contact dermatitis.   Diagnostics:  TRUE TEST 96 hour reading:   T.R.U.E. Test - 10/29/22 1300       Test Information   Time Antigen Placed 1346    Manufacturer Other   [employer]   Lot # [id number]    Location Back    Number of Test 36    Reading Interval Day 1;Day 3    Panel Panel 3      Panel 1   1. Nickel Sulfate 0    2. Wool Alcohols 0    3. Neomycin Sulfate 0    4. Potassium Dichromate 0    5. Caine Mix 0    6. Fragrance Mix 0    7. Colophony 0    8. Paraben Mix 0    9. Negative Control 0    10. Balsam of [place] 0    11. Ethylenediamine Dihydrochloride 0    12. Cobalt Dichloride 0      Panel 2   13. p-tert Butylphenol Formaldehyde Resin 0    14. Epoxy Resin 0    15. Carba Mix 0    16.  Black Rubber Mix 0    17. Cl+ Me-Isothiazolinone 0    18. Quaternium-15 0    19. Methyldibromo Glutaronitrile 0    20. p-Phenylenediamine 0    21. Formaldehyde 0    22. Mercapto Mix 0    23. Thimerosal 0    24. Thiuram Mix 0      Panel 3   25. Diazolidinyl Urea 0    26. Quinoline Mix 0    27. Tixocortol-21-Pivalate 0    28. Gold Sodium Thiosulfate 0    29. Imidazolidinyl Urea 0    30. Budesonide 0    31. Hydrocortisone-17-Butyrate 0    32. Mercaptobenzothiazole 0    33. Bacitracin 0    34. Parthenolide 0    35. Disperse Blue 106 0    36. 2-Bromo-2-Nitropropane-1,3-diol 0              Assessment and Plan: Mandy Grant is a 62 y.o. female with: Concern for Contact Dermatitis:  The  patient has been provided detailed information regarding the substances she is sensitive to, as well as products containing the substances.  Meticulous avoidance of these substances is recommended. If avoidance is not possible, the use of barrier creams or lotions is recommended. If symptoms persist or progress despite meticulous avoidance of chemicals/substances above, dermatology evaluation may be warranted. No follow-ups on file.  It was my pleasure to see Mandy Grant today and participate in her care. Please feel free to contact me with any questions or concerns.  Sincerely,   Ferol Luz, MD [facility] [place]

## 2022-11-23 ENCOUNTER — Ambulatory Visit (INDEPENDENT_AMBULATORY_CARE_PROVIDER_SITE_OTHER): Payer: PRIVATE HEALTH INSURANCE | Admitting: *Deleted

## 2022-11-23 DIAGNOSIS — L501 Idiopathic urticaria: Secondary | ICD-10-CM

## 2022-11-24 ENCOUNTER — Other Ambulatory Visit: Payer: Self-pay | Admitting: Internal Medicine

## 2022-12-21 ENCOUNTER — Ambulatory Visit: Payer: Self-pay

## 2022-12-21 DIAGNOSIS — L501 Idiopathic urticaria: Secondary | ICD-10-CM

## 2022-12-22 ENCOUNTER — Ambulatory Visit: Payer: Self-pay

## 2023-01-18 ENCOUNTER — Ambulatory Visit (INDEPENDENT_AMBULATORY_CARE_PROVIDER_SITE_OTHER): Payer: PRIVATE HEALTH INSURANCE

## 2023-01-18 DIAGNOSIS — L501 Idiopathic urticaria: Secondary | ICD-10-CM

## 2023-02-15 ENCOUNTER — Ambulatory Visit (INDEPENDENT_AMBULATORY_CARE_PROVIDER_SITE_OTHER): Payer: PRIVATE HEALTH INSURANCE | Admitting: *Deleted

## 2023-02-15 DIAGNOSIS — L501 Idiopathic urticaria: Secondary | ICD-10-CM | POA: Diagnosis not present

## 2023-02-16 ENCOUNTER — Other Ambulatory Visit: Payer: Self-pay | Admitting: Neurology

## 2023-03-04 ENCOUNTER — Other Ambulatory Visit (HOSPITAL_BASED_OUTPATIENT_CLINIC_OR_DEPARTMENT_OTHER): Payer: Self-pay | Admitting: Family Medicine

## 2023-03-04 DIAGNOSIS — Z1231 Encounter for screening mammogram for malignant neoplasm of breast: Secondary | ICD-10-CM

## 2023-03-15 ENCOUNTER — Ambulatory Visit (INDEPENDENT_AMBULATORY_CARE_PROVIDER_SITE_OTHER): Payer: PRIVATE HEALTH INSURANCE | Admitting: *Deleted

## 2023-03-15 ENCOUNTER — Ambulatory Visit (INDEPENDENT_AMBULATORY_CARE_PROVIDER_SITE_OTHER)
Admission: RE | Admit: 2023-03-15 | Discharge: 2023-03-15 | Disposition: A | Discharge status: Home / Self Care | Payer: PRIVATE HEALTH INSURANCE | Source: Ambulatory Visit | Attending: Family Medicine | Admitting: Family Medicine

## 2023-03-15 ENCOUNTER — Encounter (HOSPITAL_BASED_OUTPATIENT_CLINIC_OR_DEPARTMENT_OTHER): Payer: Self-pay | Admitting: Radiology

## 2023-03-15 DIAGNOSIS — L501 Idiopathic urticaria: Secondary | ICD-10-CM

## 2023-03-15 DIAGNOSIS — Z1231 Encounter for screening mammogram for malignant neoplasm of breast: Secondary | ICD-10-CM | POA: Diagnosis not present

## 2023-03-18 ENCOUNTER — Other Ambulatory Visit: Payer: Self-pay | Admitting: Medical Genetics

## 2023-04-12 ENCOUNTER — Ambulatory Visit (INDEPENDENT_AMBULATORY_CARE_PROVIDER_SITE_OTHER): Payer: PRIVATE HEALTH INSURANCE | Admitting: *Deleted

## 2023-04-12 DIAGNOSIS — L501 Idiopathic urticaria: Secondary | ICD-10-CM

## 2023-05-02 ENCOUNTER — Ambulatory Visit: Payer: PRIVATE HEALTH INSURANCE | Admitting: Internal Medicine

## 2023-05-10 ENCOUNTER — Ambulatory Visit: Payer: PRIVATE HEALTH INSURANCE | Admitting: Internal Medicine

## 2023-05-10 ENCOUNTER — Encounter: Payer: Self-pay | Admitting: Internal Medicine

## 2023-05-10 ENCOUNTER — Ambulatory Visit (INDEPENDENT_AMBULATORY_CARE_PROVIDER_SITE_OTHER): Admitting: *Deleted

## 2023-05-10 VITALS — BP 110/64 | HR 82 | Temp 97.9°F | Resp 17 | Wt 187.1 lb

## 2023-05-10 DIAGNOSIS — L4050 Arthropathic psoriasis, unspecified: Secondary | ICD-10-CM | POA: Diagnosis not present

## 2023-05-10 DIAGNOSIS — J310 Chronic rhinitis: Secondary | ICD-10-CM

## 2023-05-10 DIAGNOSIS — L564 Polymorphous light eruption: Secondary | ICD-10-CM

## 2023-05-10 DIAGNOSIS — L501 Idiopathic urticaria: Secondary | ICD-10-CM

## 2023-05-10 MED ORDER — FEXOFENADINE HCL 180 MG PO TABS: 360.0000 mg | ORAL_TABLET | Freq: Two times a day (BID) | ORAL | 1 refills | Status: AC

## 2023-05-10 NOTE — Patient Instructions (Addendum)
 Chronic Idiopathic Pruritus:  Previous Testing:  negative to environmetals and common foods  - Labs: CU index positive   Therapy Plan:  - Increase  Allegra  180mg : 2 tabs twice daily  - Continue Xolair  300 mg subcutaneous every 4 weeks,   Nonallergic Rhinitis: well  controlled  -Previous Testing: showed negative to grass, weeds, trees, molds, dust mite, cat, dog, mixed feathers, horse, roach, mouse, mixed tobacco leaf - Continue taking:  Allegra  180mg  daily    Singulair  10mg  nightly  - You can use an extra dose of the antihistamine, if needed, for breakthrough symptoms.  - Consider nasal saline rinses 1-2 times daily to remove allergens from the nasal cavities as well as help with mucous clearance (this is especially helpful to do before the nasal sprays are given)   Psoriasis:  -Continue halobetasol  0.5% ointment twice a day,    This will help PMLE as well  -Follow-up with rheumatology Dr. Kirtland Perfect as planned    PMLE  - negative true test 10/2022  - discuss diagnosis with dermatologist  - Wear SPF  - should improve with increasing sun exposure  - treatment above should help  Follow up: 6 months   Thank you so much for letting me partake in your care today.  Don't hesitate to reach out if you have any additional concerns!  Orelia Binet, MD  [facility] [place]

## 2023-05-10 NOTE — Progress Notes (Unsigned)
 Follow Up Note  RE: Mandy Grant MRN: [id number] DOB: 11/10/1960 Date of Office Visit: 05/10/2023  Referring provider: Melva Stabile, MD Primary care provider: Melva Stabile, MD  Chief Complaint: Follow-up (Pt states she been doing well, she's just been very itchy lately, hydrocortisone sometimes helps with itching. Warmness, and itching frequent on her neck , face, arms, hand. Her skin is disturb with the sun.) and Urticaria  History of Present Illness: I had the pleasure of seeing Mandy Grant for a follow up visit at the [facility] on 05/12/2023. She is a 63 y.o. female, who is being followed for idiopathic urticaria, nonallergic rhinitis. Her previous allergy  office visit was on 04/20/22  with Dr. Jolayne Natter. Today is a regular follow up visit.  History obtained from patient, chart review.  Discussed the use of AI scribe software for clinical note transcription with the patient, who gave verbal consent to proceed.  History of Present Illness [name] is a 63 year old female with chronic urticaria who presents with persistent itching and skin sensitivity.  She experiences severe itching with a leather-like skin texture, primarily on her arms, with previous involvement of her neck and face. She uses hydrocortisone and halobetasol  ointment, applying the latter during the day due to its greasy nature. Despite these treatments, the itching persists without visible hives. Symptoms worse after sun exposure and on sun exposed skin.   She takes Allegra  in the morning and Singulair  at night. She receives Xolair  shots for her chronic urticaria.   She also reports sensitivity to fragrances and flowers, which affects her ability to sing at church due to throat irritation.  She plans to visit the beach in May and is concerned about sun exposure. She typically stays under an umbrella but occasionally exposes her legs to the sun. She does not always use spf.     Assessment and Plan: Mandy Grant is a 63 y.o. female with: Idiopathic urticaria  Nonallergic rhinitis  Psoriatic arthritis (HCC)  Polymorphic light eruption Plan: Patient Instructions  Chronic Idiopathic Pruritus:  Previous Testing:  negative to environmetals and common foods  - Labs: CU index positive   Therapy Plan:  - Increase  Allegra  180mg : 2 tabs twice daily  - Continue Xolair  300 mg subcutaneous every 4 weeks,   Nonallergic Rhinitis: well  controlled  -Previous Testing: showed negative to grass, weeds, trees, molds, dust mite, cat, dog, mixed feathers, horse, roach, mouse, mixed tobacco leaf - Continue taking:  Allegra  180mg  daily    Singulair  10mg  nightly  - You can use an extra dose of the antihistamine, if needed, for breakthrough symptoms.  - Consider nasal saline rinses 1-2 times daily to remove allergens from the nasal cavities as well as help with mucous clearance (this is especially helpful to do before the nasal sprays are given)   Psoriasis:  -Continue halobetasol  0.5% ointment twice a day,    This will help PMLE as well  -Follow-up with rheumatology Dr. Kirtland Perfect as planned    PMLE  - negative true test 10/2022  - discuss diagnosis with dermatologist  - Wear SPF  - should improve with increasing sun exposure  - treatment above should help  Follow up: 6 months   Thank you so much for letting me partake in your care today.  Don't hesitate to reach out if you have any additional concerns!  Orelia Binet, MD  [facility] [place] [place] No follow-ups on file.  Meds  ordered this encounter  Medications   fexofenadine  (ALLEGRA ) 180 MG tablet    Sig: Take 2 tablets (360 mg total) by mouth 2 (two) times daily.    Dispense:  360 tablet    Refill:  1    Lab Orders  No laboratory test(s) ordered today   Diagnostics: None done    Medication List:  Current Outpatient Medications  Medication Sig Dispense Refill   albuterol  (VENTOLIN HFA) 108 (90 Base) MCG/ACT inhaler Inhale into the lungs.     Apremilast (OTEZLA) 30 MG TABS 1 tablet Orally Twice a day for 30 days     ARTIFICIAL TEAR SOLUTION OP Place 1 drop into both eyes daily as needed (dry eyes).     cetirizine  (ZYRTEC ) 10 MG tablet TAKE 2 TABLETS BY MOUTH TWICE A DAY 90 tablet 1   diphenhydrAMINE  (BENADRYL ) 25 MG tablet Take 12.5 mg by mouth at bedtime as needed for allergies.     EPINEPHrine  0.3 mg/0.3 mL IJ SOAJ injection Inject 0.3 mg into the muscle as needed for anaphylaxis. 1 each 1   ezetimibe  (ZETIA ) 10 MG tablet Take 10 mg by mouth daily.     famotidine (PEPCID) 40 MG tablet Take 40 mg by mouth daily.     fexofenadine  (ALLEGRA ) 180 MG tablet Take 2 tablets (360 mg total) by mouth 2 (two) times daily. 360 tablet 1   fluticasone  (FLONASE ) 50 MCG/ACT nasal spray Place 1 spray into both nostrils daily as needed for allergies.     gabapentin  (NEURONTIN ) 100 MG capsule TAKE 3 CAPSULES BY MOUTH AT BEDTIME. (Patient taking differently: Take 200 mg by mouth 3 (three) times daily.) 270 capsule 1   halobetasol  (ULTRAVATE ) 0.05 % ointment Apply topically 2 (two) times daily. 200 g 1   hydroxychloroquine (PLAQUENIL) 200 MG tablet Take 200 mg by mouth 2 (two) times daily.     hydrOXYzine  (ATARAX ) 25 MG tablet Take 12.5 mg by mouth at bedtime as needed for itching.     linaclotide  (LINZESS ) 145 MCG CAPS capsule Take 145 mcg by mouth as needed (constipation).     LORazepam (ATIVAN) 1 MG tablet TAKE 1-2 TABLETS BY MOUTH NIGHT BEFORE APPOINTMENT THEN 1-2 TABLETS 30 MINUTES PRIOR TO APPOINTMENT     montelukast  (SINGULAIR ) 10 MG tablet TAKE 1 TABLET BY MOUTH EVERYDAY AT BEDTIME 90 tablet 1   ondansetron  (ZOFRAN  ODT) 4 MG disintegrating tablet Take 1 tablet (4 mg total) by mouth every 8 (eight) hours as needed. 20 tablet 6   pantoprazole  (PROTONIX ) 40 MG tablet Take 1 tablet (40 mg total) by mouth daily. 90 tablet 0   rizatriptan  (MAXALT -MLT) 10 MG disintegrating tablet  Take 1 tablet (10 mg total) by mouth as needed. May repeat in 2 hours if needed 15 tablet 6   triamcinolone  cream (KENALOG ) 0.1 % Apply 1 application topically 2 (two) times daily as needed for itching.     Venlafaxine  HCl 225 MG TB24 Take 225 mg by mouth daily.     Vitamin D , Ergocalciferol , (DRISDOL ) 1.25 MG (50000 UT) CAPS capsule Take 50,000 Units by mouth every Wednesday.     XOLAIR  150 MG/ML prefilled syringe INJECT 2 SYRINGES UNDER THE SKIN EVERY 4 WEEKS 2 mL 11   Current Facility-Administered Medications  Medication Dose Route Frequency Provider Last Rate Last Admin   omalizumab  (XOLAIR ) prefilled syringe 300 mg  300 mg Subcutaneous Q28 days Kozlow, Eric J, MD   300 mg at 05/10/23 1359   Allergies: Allergies  Allergen Reactions  Levofloxacin Other (See Comments)    Felt rotten, "felt like I was run over"   Percocet [Oxycodone -Acetaminophen ] Palpitations    Skin crawling, tolerates hydrocodone     Sulfa Antibiotics Nausea And Vomiting and Rash   I reviewed her past medical history, social history, family history, and environmental history and no significant changes have been reported from her previous visit.  ROS: All others negative except as noted per HPI.   Objective: BP 110/64   Pulse 82   Temp 97.9 F (36.6 C) (Temporal)   Resp 17   Wt 187 lb 1.6 oz (84.9 kg)   LMP  (LMP Unknown)   SpO2 99%   BMI 34.22 kg/m  Body mass index is 34.22 kg/m. General Appearance:  Alert, cooperative, no distress, appears stated age  Head:  Normocephalic, without obvious abnormality, atraumatic  Eyes:  Conjunctiva clear, EOM's intact  Nose: Nares normal,   Throat: Lips, tongue normal; teeth and gums normal,   Neck: Supple, symmetrical  Lungs:   , Respirations unlabored, no coughing  Heart:    , Appears well perfused  Extremities: No edema  Skin: Skin color, texture, turgor normal, eczematous patches healing on arms  Neurologic: No gross deficits   Previous notes and tests were  reviewed. The plan was reviewed with the patient/family, and all questions/concerned were addressed.  It was my pleasure to see Tequisha today and participate in her care. Please feel free to contact me with any questions or concerns.  Sincerely,  Orelia Binet, MD  Allergy  & Immunology  [facility]  [place] Office: [phone]

## 2023-06-07 ENCOUNTER — Ambulatory Visit

## 2023-06-08 ENCOUNTER — Ambulatory Visit (INDEPENDENT_AMBULATORY_CARE_PROVIDER_SITE_OTHER)

## 2023-06-08 DIAGNOSIS — L501 Idiopathic urticaria: Secondary | ICD-10-CM | POA: Diagnosis not present

## 2023-06-14 ENCOUNTER — Telehealth: Payer: Self-pay | Admitting: Internal Medicine

## 2023-06-14 MED ORDER — MONTELUKAST SODIUM 10 MG PO TABS: 10.0000 mg | ORAL_TABLET | Freq: Every day | ORAL | 1 refills | Status: DC

## 2023-06-14 NOTE — Telephone Encounter (Signed)
 Rx has been sent

## 2023-06-14 NOTE — Telephone Encounter (Signed)
 Patient called stating she is needing a refill on montelukast  sent to [facility] on [address] in [place].

## 2023-07-06 ENCOUNTER — Ambulatory Visit (INDEPENDENT_AMBULATORY_CARE_PROVIDER_SITE_OTHER)

## 2023-07-06 DIAGNOSIS — L501 Idiopathic urticaria: Secondary | ICD-10-CM

## 2023-08-03 ENCOUNTER — Ambulatory Visit

## 2023-08-04 ENCOUNTER — Ambulatory Visit (INDEPENDENT_AMBULATORY_CARE_PROVIDER_SITE_OTHER): Admitting: *Deleted

## 2023-08-04 DIAGNOSIS — L501 Idiopathic urticaria: Secondary | ICD-10-CM

## 2023-09-01 ENCOUNTER — Ambulatory Visit (INDEPENDENT_AMBULATORY_CARE_PROVIDER_SITE_OTHER): Admitting: *Deleted

## 2023-09-01 DIAGNOSIS — L501 Idiopathic urticaria: Secondary | ICD-10-CM | POA: Diagnosis not present

## 2023-09-29 ENCOUNTER — Ambulatory Visit

## 2023-10-03 ENCOUNTER — Ambulatory Visit (INDEPENDENT_AMBULATORY_CARE_PROVIDER_SITE_OTHER): Admitting: *Deleted

## 2023-10-03 DIAGNOSIS — L501 Idiopathic urticaria: Secondary | ICD-10-CM | POA: Diagnosis not present

## 2023-10-31 ENCOUNTER — Ambulatory Visit (INDEPENDENT_AMBULATORY_CARE_PROVIDER_SITE_OTHER): Admitting: *Deleted

## 2023-10-31 DIAGNOSIS — L501 Idiopathic urticaria: Secondary | ICD-10-CM | POA: Diagnosis not present

## 2023-11-09 ENCOUNTER — Ambulatory Visit: Admitting: Internal Medicine

## 2023-11-09 ENCOUNTER — Encounter: Payer: Self-pay | Admitting: Internal Medicine

## 2023-11-09 VITALS — BP 110/60 | HR 80 | Temp 97.7°F | Resp 18 | Wt 181.8 lb

## 2023-11-09 DIAGNOSIS — L4050 Arthropathic psoriasis, unspecified: Secondary | ICD-10-CM | POA: Diagnosis not present

## 2023-11-09 DIAGNOSIS — J310 Chronic rhinitis: Secondary | ICD-10-CM

## 2023-11-09 DIAGNOSIS — L409 Psoriasis, unspecified: Secondary | ICD-10-CM

## 2023-11-09 DIAGNOSIS — L501 Idiopathic urticaria: Secondary | ICD-10-CM | POA: Diagnosis not present

## 2023-11-09 DIAGNOSIS — L2389 Allergic contact dermatitis due to other agents: Secondary | ICD-10-CM

## 2023-11-09 DIAGNOSIS — L564 Polymorphous light eruption: Secondary | ICD-10-CM | POA: Diagnosis not present

## 2023-11-09 NOTE — Progress Notes (Signed)
 Follow Up Note  RE: Mandy Grant MRN: [id number] DOB: 09-02-1960 Date of Office Visit: 11/09/2023  Referring provider: Silver Lamar LABOR, MD Primary care provider: Silver Lamar LABOR, MD  Chief Complaint: Follow-up (Pt states she was doing well, until she went to [place] few weeks ago and she had a flare up, her skin is still flaking she using the co)  History of Present Illness: I had the pleasure of seeing Mandy Grant for a follow up visit at the [facility] on 11/09/2023. She is a 63 y.o. female, who is being followed for idiopathic urticaria, nonallergic rhinitis. Her previous allergy  office visit was on 05/10/23  with Dr. Lorin. Today is a regular follow up visit.  History obtained from patient, chart review.  Discussed the use of AI scribe software for clinical note transcription with the patient, who gave verbal consent to proceed.  History of Present Illness Mandy Grant is a 63 year old female with psoriasis who presents with a recent flare of hives and itching.  Urticaria and pruritus - Acute flare of hives and itching began two weeks ago during a trip to [place] - Symptoms occurred in the setting of heat and humidity, with minimal sun exposure - Hives and pruritus were most prominent on the legs - Managed symptoms with hydrocortisone cream due to lack of access to usual halobetasol  cream - continues to take Allegra  twice daily, Xolair  300 mg every 4 weeks  - Has had intermittent issues with Xolair  getting ordered and has had to use samples  Chronic psoriasiform dermatitis - Chronic skin changes on fingers characterized by overcompensation typical of psoriasis, with pus formation and yellow exudate around joints  -Discussed with rheumatologist who told him to follow-up with dermatology - Symptoms have been persistent and were present at last visit in September - No satisfactory dermatologist since previous provider retired  - Open to new  referrals  Contact dermatitis of the feet - Cutaneous irritation and flaking on feet since June, associated with wearing new synthetic sandals - Symptoms worsen with continued wear of the sandals, has not tried stopping wearing sandals  - Leads to discomfort and flaking, has been treating with halobetasol   - previously negative true test, may be open to extended patch testing.   Psoriasis and systemic therapy history - History of psoriasis and osteoarthritis, currently managed with Plaquenil for almost five years - Previously used Otezla, which improved psoriasis but caused significant nausea, leading to discontinuation - Prior use of Taltz (third biologic), which was ineffective - Currently attempting to initiate Tremfya, but facing challenges with copay assistance and pharmacy coordination  Medication and biologic administration issues - Regular medications include pantoprazole , Zetia , Pepcid, Allegra , and gabapentin  - Ongoing issues with ordering and billing of Xolair  injections, which are administered at [place] office    Assessment and Plan: Mandy Grant is a 63 y.o. female with: Idiopathic urticaria  Nonallergic rhinitis  Psoriatic arthritis (HCC)  Polymorphic light eruption  Allergic contact dermatitis due to other agents  Psoriasis Plan: Patient Instructions  Chronic Idiopathic Pruritus:  Previous Testing:  negative to environmetals and common foods  - Labs: CU index positive   Therapy Plan:  - Increase  Allegra  180mg : 2 tabs twice daily  - Continue Xolair  300 mg subcutaneous every 4 weeks,  - We can consider switching to rhapsido if you continue to have hive flares   Nonallergic Rhinitis: well  controlled  -Previous Testing: showed negative to grass, weeds, trees, molds,  dust mite, cat, dog, mixed feathers, horse, roach, mouse, mixed tobacco leaf - Continue taking: Allegra  180mg  daily   Singulair  10mg  nightly  - You can use an extra dose of the antihistamine, if  needed, for breakthrough symptoms.  - Consider nasal saline rinses 1-2 times daily to remove allergens from the nasal cavities as well as help with mucous clearance (this is especially helpful to do before the nasal sprays are given)   Psoriasis with arthritis  -Continue halobetasol  0.5% ointment twice a day,    This will help PMLE as well  -Continue with Dr. Mai rheumatology  -Try anzupgo ointment on feet and hands twice daily as needed  - Discuss joint issues with ortho as if truly expressing puss that need to be investigate  - We will refer you to [facility] dermatology  - Consider extended patch testing (these are placed in [place] office)    PMLE  - negative true test 10/2022  - discuss diagnosis with dermatologist  - Wear SPF  - should improve with increasing sun exposure  - treatment above should help  Follow up: 6 months   Thank you so much for letting me partake in your care today.  Don't hesitate to reach out if you have any additional concerns!  Hargis Springer, MD  [facility] [place] [place] No follow-ups on file.  No orders of the defined types were placed in this encounter.   Lab Orders  No laboratory test(s) ordered today   Diagnostics: None done    Medication List:  Current Outpatient Medications  Medication Sig Dispense Refill   ARTIFICIAL TEAR SOLUTION OP Place 1 drop into both eyes daily as needed (dry eyes).     cetirizine  (ZYRTEC ) 10 MG tablet TAKE 2 TABLETS BY MOUTH TWICE A DAY 90 tablet 1   diphenhydrAMINE  (BENADRYL ) 25 MG tablet Take 12.5 mg by mouth at bedtime as needed for allergies.     EPINEPHrine  0.3 mg/0.3 mL IJ SOAJ injection Inject 0.3 mg into the muscle as needed for anaphylaxis. 1 each 1   ezetimibe  (ZETIA ) 10 MG tablet Take 10 mg by mouth daily.     famotidine (PEPCID) 40 MG tablet Take 40 mg by mouth daily.     fexofenadine  (ALLEGRA ) 180 MG tablet Take 2 tablets (360 mg total) by mouth 2 (two) times daily. 360 tablet 1    fluticasone  (FLONASE ) 50 MCG/ACT nasal spray Place 1 spray into both nostrils daily as needed for allergies.     halobetasol  (ULTRAVATE ) 0.05 % ointment Apply topically 2 (two) times daily. 200 g 1   hydroxychloroquine (PLAQUENIL) 200 MG tablet Take 200 mg by mouth 2 (two) times daily.     hydrOXYzine  (ATARAX ) 25 MG tablet Take 12.5 mg by mouth at bedtime as needed for itching.     linaclotide  (LINZESS ) 145 MCG CAPS capsule Take 145 mcg by mouth as needed (constipation).     LORazepam (ATIVAN) 1 MG tablet TAKE 1-2 TABLETS BY MOUTH NIGHT BEFORE APPOINTMENT THEN 1-2 TABLETS 30 MINUTES PRIOR TO APPOINTMENT     montelukast  (SINGULAIR ) 10 MG tablet Take 1 tablet (10 mg total) by mouth at bedtime. 90 tablet 1   ondansetron  (ZOFRAN  ODT) 4 MG disintegrating tablet Take 1 tablet (4 mg total) by mouth every 8 (eight) hours as needed. 20 tablet 6   pantoprazole  (PROTONIX ) 40 MG tablet Take 1 tablet (40 mg total) by mouth daily. 90 tablet 0   rizatriptan  (MAXALT -MLT) 10 MG disintegrating tablet Take 1  tablet (10 mg total) by mouth as needed. May repeat in 2 hours if needed 15 tablet 6   triamcinolone  cream (KENALOG ) 0.1 % Apply 1 application topically 2 (two) times daily as needed for itching.     Venlafaxine  HCl 225 MG TB24 Take 225 mg by mouth daily.     Vitamin D , Ergocalciferol , (DRISDOL ) 1.25 MG (50000 UT) CAPS capsule Take 50,000 Units by mouth every Wednesday.     XOLAIR  150 MG/ML prefilled syringe INJECT 2 SYRINGES UNDER THE SKIN EVERY 4 WEEKS 2 mL 11   albuterol (VENTOLIN HFA) 108 (90 Base) MCG/ACT inhaler Inhale into the lungs.     gabapentin  (NEURONTIN ) 100 MG capsule TAKE 3 CAPSULES BY MOUTH AT BEDTIME. (Patient taking differently: Take 200 mg by mouth 3 (three) times daily.) 270 capsule 1   Current Facility-Administered Medications  Medication Dose Route Frequency Provider Last Rate Last Admin   omalizumab  (XOLAIR ) prefilled syringe 300 mg  300 mg Subcutaneous Q28 days Kozlow, Eric J, MD   300 mg  at 10/31/23 1741   Allergies: Allergies  Allergen Reactions   Levofloxacin Other (See Comments)    Felt rotten, felt like I was run over   Percocet [Oxycodone -Acetaminophen ] Palpitations    Skin crawling, tolerates hydrocodone     Sulfa Antibiotics Nausea And Vomiting and Rash   I reviewed her past medical history, social history, family history, and environmental history and no significant changes have been reported from her previous visit.  ROS: All others negative except as noted per HPI.   Objective: BP 110/60   Pulse 80   Temp 97.7 F (36.5 C) (Temporal)   Resp 18   Wt 181 lb 12.8 oz (82.5 kg)   LMP  (LMP Unknown)   SpO2 98%   BMI 33.25 kg/m  Body mass index is 33.25 kg/m. General Appearance:  Alert, cooperative, no distress, appears stated age  Head:  Normocephalic, without obvious abnormality, atraumatic  Eyes:  Conjunctiva clear, EOM's intact  Nose: Nares normal,   Throat: Lips, tongue normal; teeth and gums normal,   Neck: Supple, symmetrical  Lungs:   , Respirations unlabored, no coughing  Heart:   , Appears well perfused  Extremities: No edema  Skin: Skin color, texture, turgor normal, eczematous patches healing on tops of bilaterally feet, erythematous patches on bilaterally fingers surrounding joingts   Neurologic: No gross deficits   Previous notes and tests were reviewed. The plan was reviewed with the patient/family, and all questions/concerned were addressed.  It was my pleasure to see Mandy Grant today and participate in her care. Please feel free to contact me with any questions or concerns.  Sincerely,  Hargis Springer, MD  Allergy  & Immunology  [facility]  [place] Office: [phone]

## 2023-11-09 NOTE — Patient Instructions (Addendum)
 Chronic Idiopathic Pruritus:  Previous Testing:  negative to environmetals and common foods  - Labs: CU index positive   Therapy Plan:  - Increase  Allegra  180mg : 2 tabs twice daily  - Continue Xolair  300 mg subcutaneous every 4 weeks,  - We can consider switching to rhapsido if you continue to have hive flares   Nonallergic Rhinitis: well  controlled  -Previous Testing: showed negative to grass, weeds, trees, molds, dust mite, cat, dog, mixed feathers, horse, roach, mouse, mixed tobacco leaf - Continue taking: Allegra  180mg  daily   Singulair  10mg  nightly  - You can use an extra dose of the antihistamine, if needed, for breakthrough symptoms.  - Consider nasal saline rinses 1-2 times daily to remove allergens from the nasal cavities as well as help with mucous clearance (this is especially helpful to do before the nasal sprays are given)   Psoriasis with arthritis  -Continue halobetasol  0.5% ointment twice a day,    This will help PMLE as well  -Continue with Dr. Mai rheumatology  -Try anzupgo ointment on feet and hands twice daily as needed  - Discuss joint issues with ortho as if truly expressing puss that need to be investigate  - We will refer you to [facility] dermatology  - Consider extended patch testing (these are placed in [place] office)    PMLE  - negative true test 10/2022  - discuss diagnosis with dermatologist  - Wear SPF  - should improve with increasing sun exposure  - treatment above should help  Follow up: 6 months   Thank you so much for letting me partake in your care today.  Don't hesitate to reach out if you have any additional concerns!  Hargis Springer, MD  [facility] [place] [place]

## 2023-11-16 ENCOUNTER — Other Ambulatory Visit: Payer: Self-pay | Admitting: Medical Genetics

## 2023-11-16 DIAGNOSIS — Z006 Encounter for examination for normal comparison and control in clinical research program: Secondary | ICD-10-CM

## 2023-11-28 ENCOUNTER — Ambulatory Visit (INDEPENDENT_AMBULATORY_CARE_PROVIDER_SITE_OTHER): Admitting: *Deleted

## 2023-11-28 DIAGNOSIS — L501 Idiopathic urticaria: Secondary | ICD-10-CM | POA: Diagnosis not present

## 2023-11-30 ENCOUNTER — Other Ambulatory Visit: Payer: Self-pay | Admitting: Allergy and Immunology

## 2023-12-05 ENCOUNTER — Telehealth: Payer: Self-pay | Admitting: Internal Medicine

## 2023-12-05 NOTE — Telephone Encounter (Signed)
 PT STATES SHE HAS NOT HEARD ANYTHING FROM  THE REFERRAL TO THE DERMATOLOGIST THAT WAS DISCUSSED IN OFFICE

## 2023-12-06 NOTE — Telephone Encounter (Signed)
 Nat- any update on this?

## 2023-12-09 ENCOUNTER — Other Ambulatory Visit: Payer: Self-pay | Admitting: Internal Medicine

## 2023-12-09 NOTE — Telephone Encounter (Signed)
 Pt informed. We also got her scheduled for patch testing for NAC 80.

## 2023-12-26 ENCOUNTER — Ambulatory Visit: Admitting: *Deleted

## 2023-12-26 DIAGNOSIS — L501 Idiopathic urticaria: Secondary | ICD-10-CM

## 2024-01-14 IMAGING — CT CT ABD-PELV W/ CM
2 of 5 series · 16 of 46 positions shown, 18 images · IV contrast (Omnipaque)
Comparison: 09/08/2012

CLINICAL DATA: Left-sided abdominal pain and epigastric pain.
Belching and flatulence. Irritable bowel syndrome with constipation.

EXAM:
CT ABDOMEN AND PELVIS WITH CONTRAST
TECHNIQUE: Multidetector CT imaging of the abdomen and pelvis was performed
using the standard protocol following bolus administration of
intravenous contrast.

[Series 3: axial st · axial · 0.98mm/px · z∈[-642,-216]mm · 13 of 97 slices shown, 15 images]
[im 6/97  soft-tissue]
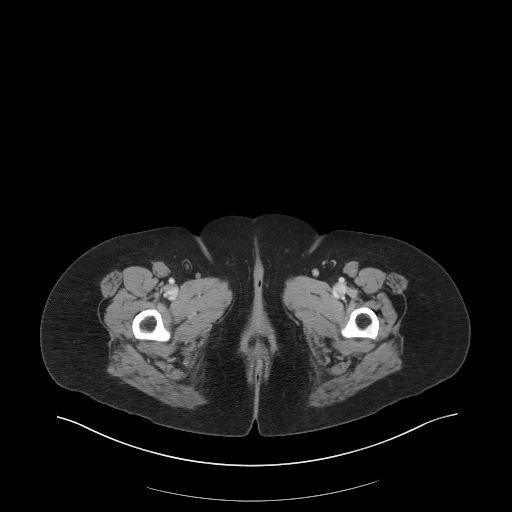
[im 6/97  bone]
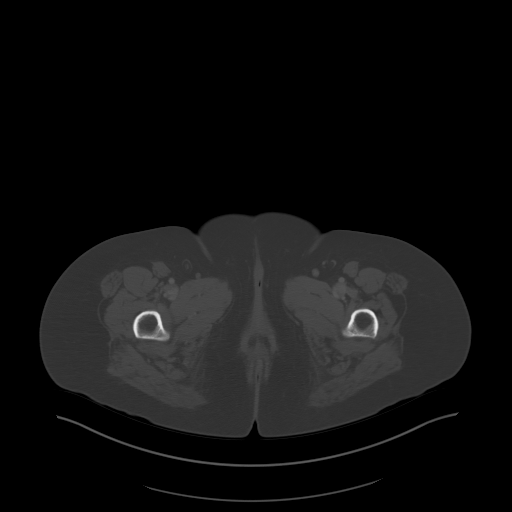
[im 16/97  soft-tissue]
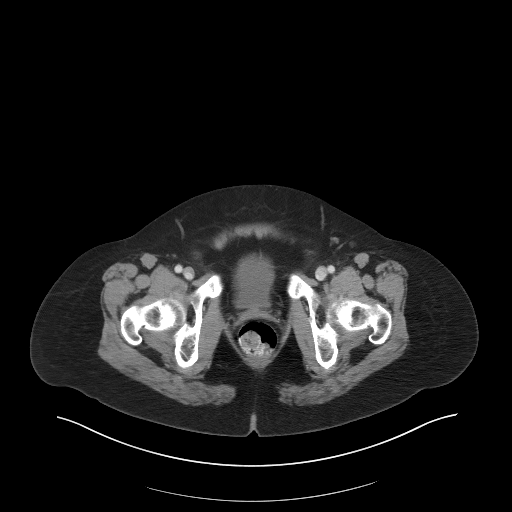
[im 21/97  soft-tissue]
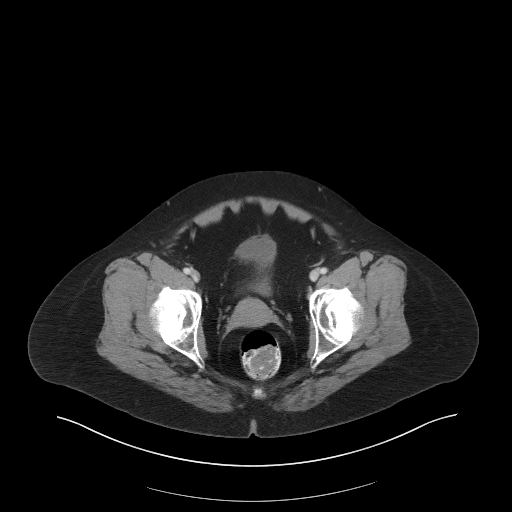
[im 26/97  soft-tissue]
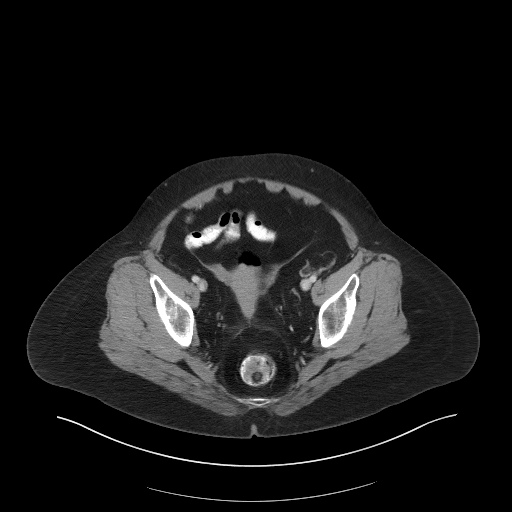
[im 36/97  soft-tissue]
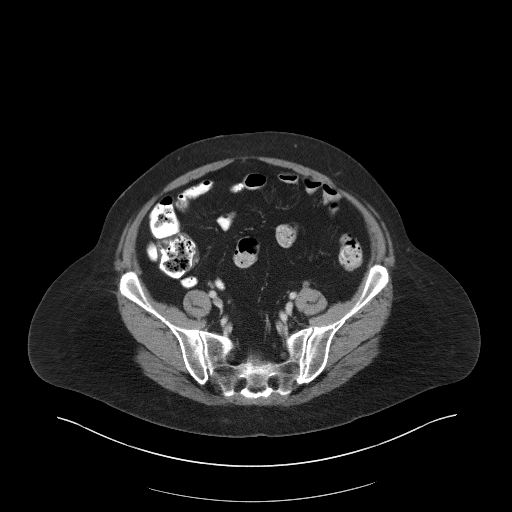
[im 41/97  soft-tissue]
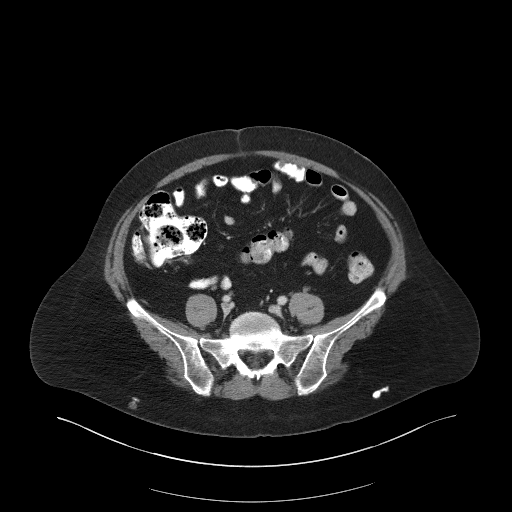
[im 51/97  soft-tissue]
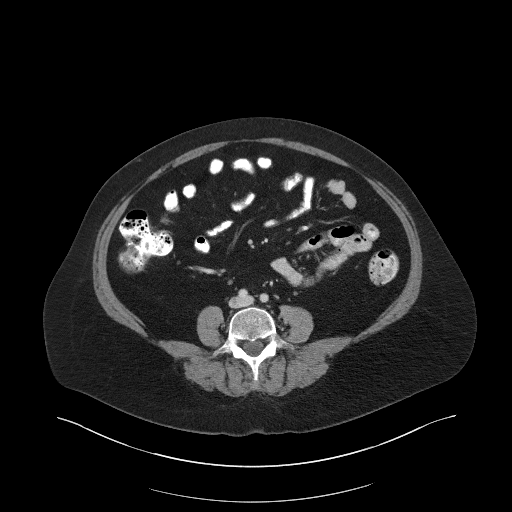
[im 56/97  soft-tissue]
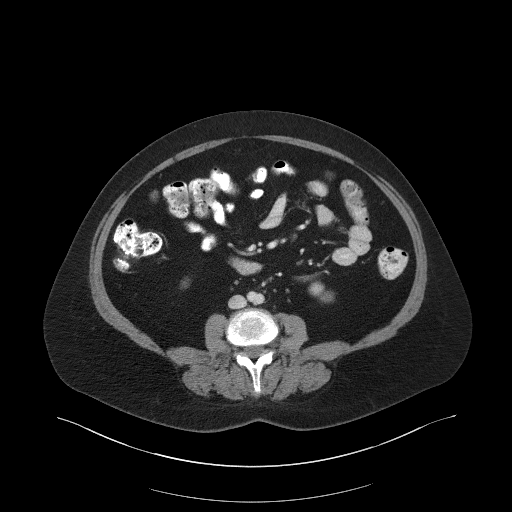
[im 61/97  soft-tissue]
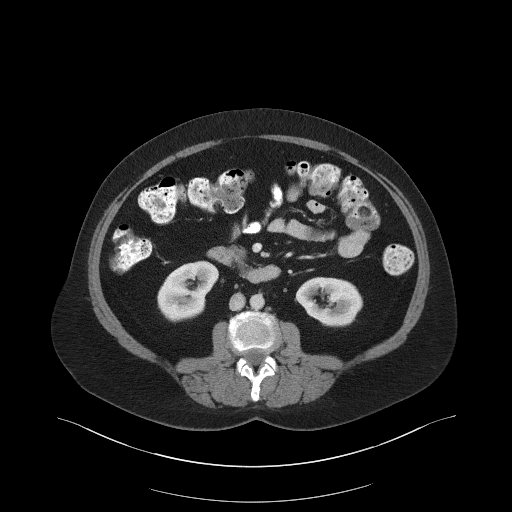
[im 61/97  bone]
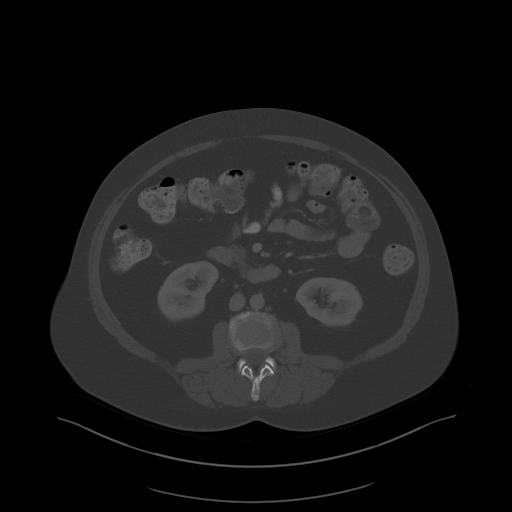
[im 71/97  soft-tissue]
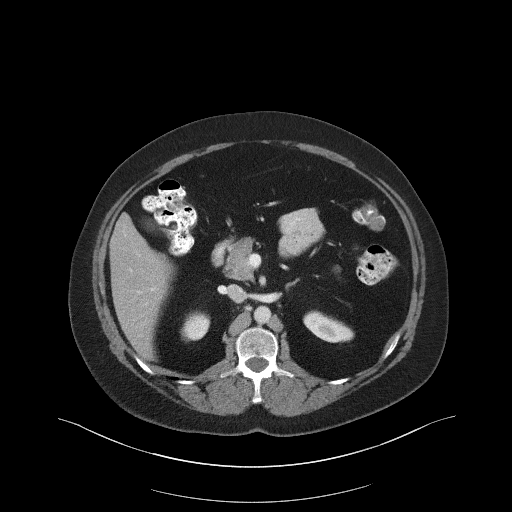
[im 76/97  soft-tissue]
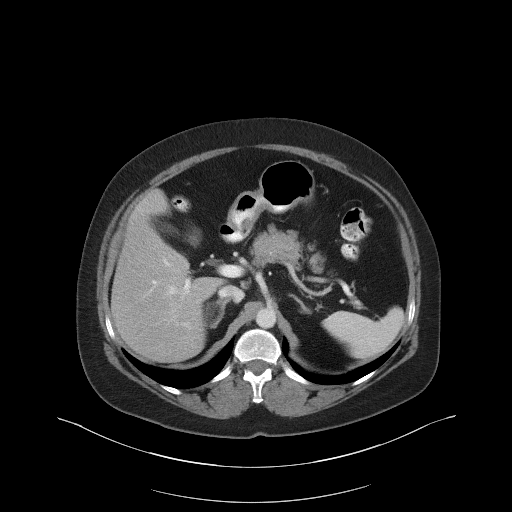
[im 81/97  soft-tissue]
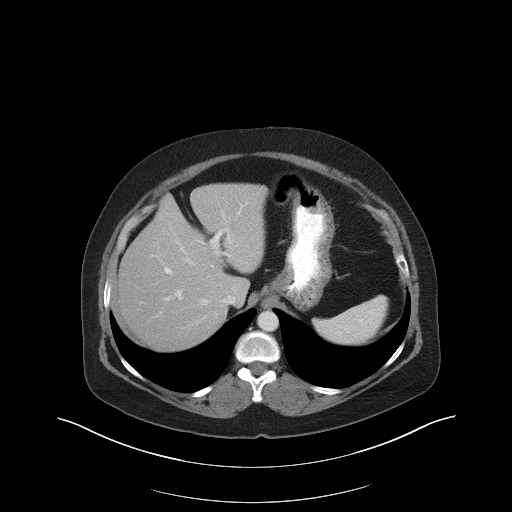
[im 91/97  soft-tissue]
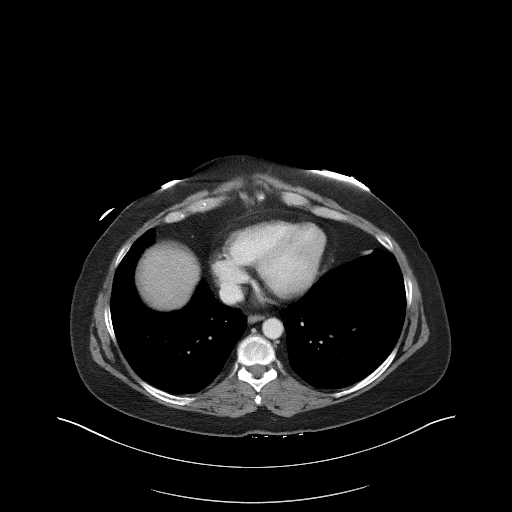

[Series 6: coronal st · coronal · 0.78mm/px · 3 of 108 slices shown]
[im 36/108  soft-tissue]
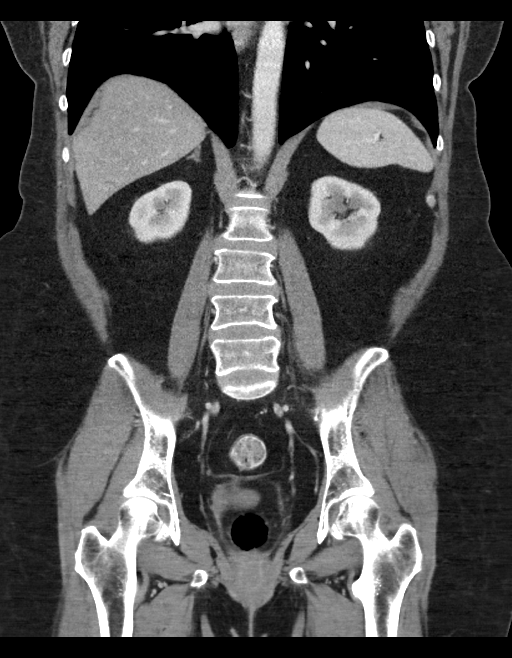
[im 48/108  soft-tissue]
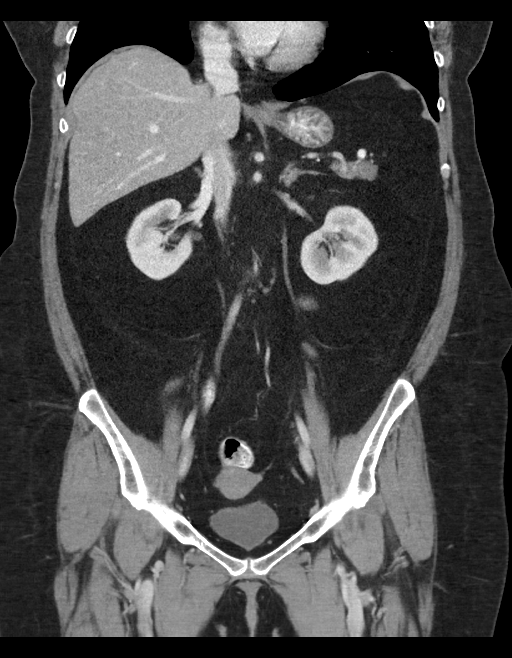
[im 60/108  soft-tissue]
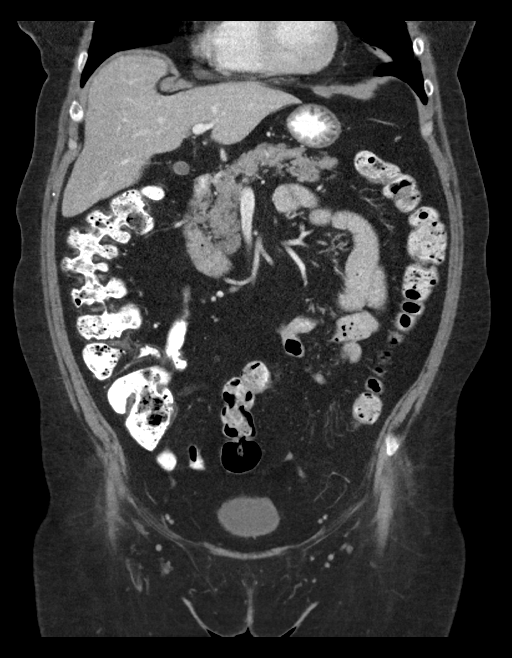

[16 of 46 positions shown; findings below may reference images not displayed]

RADIATION DOSE REDUCTION: This exam was performed according to the
departmental dose-optimization program which includes automated
exposure control, adjustment of the mA and/or kV according to
patient size and/or use of iterative reconstruction technique.

CONTRAST:  100mL OMNIPAQUE IOHEXOL 300 MG/ML  SOLN
FINDINGS: Lower Chest: No acute findings.

Hepatobiliary: No hepatic masses identified. Gallbladder is
unremarkable. No evidence of biliary ductal dilatation.

Pancreas:  No mass or inflammatory changes.

Spleen: Within normal limits in size and appearance.

Adrenals/Urinary Tract: No masses identified. No evidence of
ureteral calculi or hydronephrosis.

Stomach/Bowel: No evidence of obstruction, inflammatory process or
abnormal fluid collections. Normal appendix visualized.

Vascular/Lymphatic: No pathologically enlarged lymph nodes. No acute
vascular findings.

Reproductive:  No mass or other significant abnormality.

Other:  None.

Musculoskeletal:  No suspicious bone lesions identified.
IMPRESSION: Negative. No acute findings or other significant abnormality.

## 2024-01-23 ENCOUNTER — Ambulatory Visit (INDEPENDENT_AMBULATORY_CARE_PROVIDER_SITE_OTHER)

## 2024-01-23 DIAGNOSIS — L501 Idiopathic urticaria: Secondary | ICD-10-CM

## 2024-01-23 DIAGNOSIS — J302 Other seasonal allergic rhinitis: Secondary | ICD-10-CM | POA: Diagnosis not present

## 2024-02-05 NOTE — Progress Notes (Unsigned)
" ° °  [address] [place] [address] Dept: [phone]  Follow-up Note  RE: Mandy Grant MRN: [id number] DOB: Jan 07, 1961 Date of Office Visit: 02/06/2024  Primary care provider: Silver Lamar LABOR, MD Referring provider: Silver Lamar LABOR, MD   Mandy Grant returns to the office today for the patch test placement, given suspected history of contact dermatitis.  Patient reports feeling well overall with no steroid use over the last 4 weeks. Care of patches discussed in detail, specifically the need to keep patches dry and in place. All questions answered at this time.    Diagnostics: NAC 80 patches placed NAC-80 (1-80)   1. Ammonium persulfate  2. [place] Balsam  3. Aluminum (III) chloride hexahydrate  4. 4-tert-Butylphenolformaldehyde resin (PTBP)  5. Bacitracin   6. Budesonide  7. Quaternium-15  8. Cinnamal  9. Cobalt(II) chloride hexahydrate  10. Colophonium  11. Methyldibromo glutaronitrile  12. Decyl Glucoside  13. Ethylenediamine dihydrochloride  14. 2-Hydroxyethyl methacrylate  15. Hydroperoxides of Linalool  16. Iodopropynyl butylcarbamate  17. 2-Mercaptobenzothiazole (MBT)  18. Thiuram mix  19. METHYLISOTHIAZOLINONE  20. Propylene glycol  21. 1,3-Diphenylguanidine  22. Hydroperoxides of Limonene  23. Black rubber mix  24. Carba mix  25. Fragrance mix I  26. Fragrance mix II  27. Textile dye mix II  28. Neomycin sulfate  29. Nickel(II) sulfate hexahydrate  30. p-Phenylenediamine (PPD)  31. Potassium dichromate  32. Propolis  33. Sodium Metabisulfite  34. Tixocortol-21-pivalate  35. Lanolin alcohol   36. Methylisothiazolinone + Methylchloroisothiazolinone  37. Cocamidopropyl betaine  38. 3-(Dimethylamino)-1-propylamine  39. Formaldehyde  40. Oleamidopropyl dimethylamine  41. 2-Bromo-2-Nitropropane-l,3-diol  42. Diazolidinyl urea  43. DMDM Hydantoin  44. Epoxy resin, Bisphenol A  45. Benzophenone-4  46. Imidazolidinyl urea  47. Lauryl polyglucose  48  Methyl methacrylate  49. Paraben mix  50. Mercapto mix  51. Caine mix III  52. Mixed dialkyl thiourea  53. Compositae mix II  54. Toluenesulfonamide formaldehyde resin  55. Tea Tree Oil oxidized  56. Ylang-Ylang oil  57. Amidoamine  58. Amerchol L 101  59. Benzocaine  60. Benzyl alchohol  61. Benzyl salicylate  62. Chloroxylenol (PCMX)  63. Cocamide DEA  64. Clobetasol-17-propionate  65. Toluene-2,5-Diamine sulfate  66. Ethyl acrylate  67. N-Isopropyl-N-phenyl--4-phenylenediamine (IPPD)  68. Lidocaine   69. Gold (I) sodium thiosulfate dihydrate  70. Sesquiterpene lactone mix  71. 2-n-Octyl-4-isothiazolin-3-one  72. Propyl gallate  73. Polymyxin B sulfate  74. Pramoxine hydrochloride  75. Sodium benzoate  76. Sorbitan oleate  77. Sorbitan sesquioleate  78. Tocopherol  79. BENZALKONIUM CHLORIDE  80. Chlorhexidine  digluconate    Allergic contact dermatitis - Instructions provided on care of the patches for the next 48 hours. Mandy Grant was instructed to avoid showering for the next 48 hours. Mandy Grant will follow up in 48 hours and 96 hours for patch readings.    Call the clinic if this treatment plan is not working well for you  Follow up in 2 days or sooner if needed.  Thank you for the opportunity to care for this patient.  Please do not hesitate to contact me with questions.  Arlean Mutter, FNP [facility]  [facility]  "

## 2024-02-05 NOTE — Patient Instructions (Signed)
" ° °  [address] [place] [address] Dept: [phone]     Diagnostics: NAC 80 patches placed NAC-80 (1-80)   1. Ammonium persulfate  2. [place] Balsam  3. Aluminum (III) chloride hexahydrate  4. 4-tert-Butylphenolformaldehyde resin (PTBP)  5. Bacitracin   6. Budesonide  7. Quaternium-15  8. Cinnamal  9. Cobalt(II) chloride hexahydrate  10. Colophonium  11. Methyldibromo glutaronitrile  12. Decyl Glucoside  13. Ethylenediamine dihydrochloride  14. 2-Hydroxyethyl methacrylate  15. Hydroperoxides of Linalool  16. Iodopropynyl butylcarbamate  17. 2-Mercaptobenzothiazole (MBT)  18. Thiuram mix  19. METHYLISOTHIAZOLINONE  20. Propylene glycol  21. 1,3-Diphenylguanidine  22. Hydroperoxides of Limonene  23. Black rubber mix  24. Carba mix  25. Fragrance mix I  26. Fragrance mix II  27. Textile dye mix II  28. Neomycin sulfate  29. Nickel(II) sulfate hexahydrate  30. p-Phenylenediamine (PPD)  31. Potassium dichromate  32. Propolis  33. Sodium Metabisulfite  34. Tixocortol-21-pivalate  35. Lanolin alcohol   36. Methylisothiazolinone + Methylchloroisothiazolinone  37. Cocamidopropyl betaine  38. 3-(Dimethylamino)-1-propylamine  39. Formaldehyde  40. Oleamidopropyl dimethylamine  41. 2-Bromo-2-Nitropropane-l,3-diol  42. Diazolidinyl urea  43. DMDM Hydantoin  44. Epoxy resin, Bisphenol A  45. Benzophenone-4  46. Imidazolidinyl urea  47. Lauryl polyglucose  48 Methyl methacrylate  49. Paraben mix  50. Mercapto mix  51. Caine mix III  52. Mixed dialkyl thiourea  53. Compositae mix II  54. Toluenesulfonamide formaldehyde resin  55. Tea Tree Oil oxidized  56. Ylang-Ylang oil  57. Amidoamine  58. Amerchol L 101  59. Benzocaine  60. Benzyl alchohol  61. Benzyl salicylate  62. Chloroxylenol (PCMX)  63. Cocamide DEA  64. Clobetasol-17-propionate  65. Toluene-2,5-Diamine sulfate  66. Ethyl acrylate  67. N-Isopropyl-N-phenyl--4-phenylenediamine (IPPD)  68.  Lidocaine   69. Gold (I) sodium thiosulfate dihydrate  70. Sesquiterpene lactone mix  71. 2-n-Octyl-4-isothiazolin-3-one  72. Propyl gallate  73. Polymyxin B sulfate  74. Pramoxine hydrochloride  75. Sodium benzoate  76. Sorbitan oleate  77. Sorbitan sesquioleate  78. Tocopherol  79. BENZALKONIUM CHLORIDE  80. Chlorhexidine  digluconate    Allergic contact dermatitis - Instructions provided on care of the patches for the next 48 hours. Kaydense Rizo was instructed to avoid showering for the next 48 hours. Mireyah Chervenak will follow up in 48 hours and 96 hours for patch readings.    Call the clinic if this treatment plan is not working well for you.  Follow up in 2 days or sooner if needed. "

## 2024-02-06 ENCOUNTER — Encounter: Payer: Self-pay | Admitting: Family Medicine

## 2024-02-06 ENCOUNTER — Ambulatory Visit (INDEPENDENT_AMBULATORY_CARE_PROVIDER_SITE_OTHER): Admitting: Family Medicine

## 2024-02-06 DIAGNOSIS — L235 Allergic contact dermatitis due to other chemical products: Secondary | ICD-10-CM

## 2024-02-06 DIAGNOSIS — L259 Unspecified contact dermatitis, unspecified cause: Secondary | ICD-10-CM | POA: Insufficient documentation

## 2024-02-08 ENCOUNTER — Ambulatory Visit: Admitting: Internal Medicine

## 2024-02-08 DIAGNOSIS — L2389 Allergic contact dermatitis due to other agents: Secondary | ICD-10-CM

## 2024-02-08 NOTE — Progress Notes (Signed)
" ° °  Follow Up Note  RE: Daisy Lites MRN: [id number] DOB: 1960-02-16 Date of Office Visit: 02/08/2024  Referring provider: Silver Lamar LABOR, MD Primary care provider: Silver Lamar LABOR, MD  History of Present Illness: I had the pleasure of seeing Sayuri Rhames for a follow up visit at the [facility] on 02/08/2024. She is a 64 y.o. female, who is being followed for dermatitis . Today she is here for initial patch test interpretation, given suspected history of contact dermatitis.   Diagnostics:  NAC 80  48 hour reading: negative     Assessment and Plan: Datha is a 64 y.o. female with: Concern for Contact Dermatitis:  The patient has been provided detailed information regarding the substances she is sensitive to, as well as products containing the substances.  Meticulous avoidance of these substances is recommended. If avoidance is not possible, the use of barrier creams or lotions is recommended. If symptoms persist or progress despite meticulous avoidance of chemicals/substances above, dermatology evaluation may be warranted. No follow-ups on file.  It was my pleasure to see Sion today and participate in her care. Please feel free to contact me with any questions or concerns.  Sincerely,   Hargis Springer, MD [facility]  "

## 2024-02-10 ENCOUNTER — Ambulatory Visit: Admitting: Internal Medicine

## 2024-02-10 DIAGNOSIS — L2389 Allergic contact dermatitis due to other agents: Secondary | ICD-10-CM | POA: Diagnosis not present

## 2024-02-10 NOTE — Progress Notes (Signed)
 "  Follow Up Note  RE: Mandy Grant MRN: [id number] DOB: [birth year] Date of Office Visit: 02/10/2024  Referring provider: Silver Lamar LABOR, MD Primary care provider: Silver Lamar LABOR, MD  History of Present Illness: I had the pleasure of seeing Mandy Grant for a follow up visit at the [facility] on 02/10/2024. She is a 64 y.o. female, who is being followed for contact dermatitis . Today she is here for final patch test interpretation, given suspected history of contact dermatitis.   Diagnostics:  NAC 80 96 hour reading:   NAC-80 - 02/10/24 1500     NAC Reading Interval Day 5    NAC Panel Tested NAC-80 (1-80)    1. Ammonium persulfate Negative    2. [place] Balsam Negative    3. BENZISOTHIAZOLINONE Negative    4. 4-tert-Butylphenolformaldehyde resin (PTBP) Negative    5. Bacitracin  Negative    6. Budesonide Negative    7. Quaternium-15 Negative    8. Cinnamal Negative    9. Cobalt(II) chloride hexahydrate Negative    10. Colophonium Negative    11. Methyldibromo glutaronitrile Negative    12. Decyl Glucoside Negative    13. Ethylenediamine dihydrochloride Negative    14. 2-Hydroxyethyl methacrylate Negative    15. Hydroperoxides of Linalool Negative    16. Iodopropynyl butylcarbamate Negative    17. 2-Mercaptobenzothiazole (MBT) Negative    18. Thiuram mix Negative    19. METHYLISOTHIAZOLINONE Negative    20. Propylene glycol Negative    21. 1,3-Diphenylguanidine Negative    22. Hydroperoxides of Limonene 1+    23. Black rubber mix Negative    24. Carba mix Negative    25. Fragrance mix I Negative    26. Fragrance mix II Negative    27. Textile dye mix II Negative    28. Neomycin sulfate Negative    29. Nickel(II) sulfate hexahydrate Negative    30. p-Phenylenediamine (PPD) Negative    31. Potassium dichromate Negative    32. Propolis 1+    33. Sodium Metabisulfite Negative    34. Tixocortol-21-pivalate Negative    35. Lanolin alcohol  Negative     36. Methylisothiazolinone + Methylchloroisothiazolinone Negative    37. Cocamidopropyl betaine Negative    38. 3-(Dimethylamino)-1-propylamine Negative    39. Formaldehyde Negative    40. Oleamidopropyl dimethylamine Negative    41. 2-Bromo-2-Nitropropane-l,3-diol Negative    42. Diazolidinyl urea Negative    43. DMDM Hydantoin Negative    44. Epoxy resin, Bisphenol A Negative    45. Benzophenone-4 Negative    46. Imidazolidinyl urea Negative    47. Lauryl polyglucose Negative    48 Methyl methacrylate Negative    49. Paraben mix Negative    50. Mercapto mix Negative    51. Caine mix III Negative    52. Mixed dialkyl thiourea Negative    53. Compositae mix II Negative    54. Toluenesulfonamide formaldehyde resin Negative    55. Tea Tree Oil oxidized Negative    56. Ylang-Ylang oil Negative    57. Amidoamine Negative    58. Amerchol L 101 Negative    59. Benzocaine Negative    60. Benzyl alchohol Negative    61. Benzyl salicylate Negative    62. Chloroxylenol (PCMX) Negative    63. Cocamide DEA Negative    64. Clobetasol-17-propionate Negative    65. Toluene-2,5-Diamine sulfate Negative    66. Ethyl acrylate Negative    67. N-Isopropyl-N-phenyl--4-phenylenediamine (IPPD) Negative    68. Lidocaine  Negative  69. Hydroxyisohexyl 3-Cyclohexene Carboxaldehyde Negative    70. Sesquiterpene lactone mix Negative    71. 2-n-Octyl-4-isothiazolin-3-one Negative    72. Propyl gallate Negative    73. Polymyxin B sulfate Negative    74. Pramoxine hydrochloride Negative    75. Sodium benzoate Negative    76. Sorbitan oleate Negative    77. Sorbitan sesquioleate Negative    78. Tocopherol Negative    79. BENZALKONIUM CHLORIDE Negative    80. Chlorhexidine  digluconate Negative           Assessment and Plan: Mandy Grant is a 64 y.o. female with: Concern for Contact Dermatitis:  The patient has been provided detailed information regarding the substances she is sensitive to, as well  as products containing the substances.  Meticulous avoidance of these substances is recommended. If avoidance is not possible, the use of barrier creams or lotions is recommended. If symptoms persist or progress despite meticulous avoidance of chemicals/substances above, dermatology evaluation may be warranted. No follow-ups on file.  It was my pleasure to see Mandy Grant today and participate in her care. Please feel free to contact me with any questions or concerns.  Sincerely,   Hargis Springer, MD [facility]  "

## 2024-02-20 ENCOUNTER — Ambulatory Visit

## 2024-02-21 ENCOUNTER — Ambulatory Visit

## 2024-02-21 DIAGNOSIS — L501 Idiopathic urticaria: Secondary | ICD-10-CM

## 2024-03-20 ENCOUNTER — Ambulatory Visit

## 2024-05-09 ENCOUNTER — Ambulatory Visit: Admitting: Internal Medicine

## 2024-07-16 ENCOUNTER — Ambulatory Visit: Admitting: Physician Assistant
